# Patient Record
Sex: Female | Born: 1990
Health system: Southern US, Community
[De-identification: ages and names within clinical notes are randomized; demographics above are authoritative.]

## PROBLEM LIST (undated history)

## (undated) ENCOUNTER — Inpatient Hospital Stay (HOSPITAL_COMMUNITY): Payer: Self-pay

## (undated) DIAGNOSIS — F329 Major depressive disorder, single episode, unspecified: Secondary | ICD-10-CM

## (undated) DIAGNOSIS — A749 Chlamydial infection, unspecified: Secondary | ICD-10-CM

## (undated) DIAGNOSIS — F32A Depression, unspecified: Secondary | ICD-10-CM

## (undated) DIAGNOSIS — F319 Bipolar disorder, unspecified: Secondary | ICD-10-CM

## (undated) DIAGNOSIS — J45909 Unspecified asthma, uncomplicated: Secondary | ICD-10-CM

## (undated) DIAGNOSIS — F419 Anxiety disorder, unspecified: Secondary | ICD-10-CM

## (undated) DIAGNOSIS — Q860 Fetal alcohol syndrome (dysmorphic): Secondary | ICD-10-CM

## (undated) HISTORY — DX: Major depressive disorder, single episode, unspecified: F32.9

## (undated) HISTORY — PX: CHOLECYSTECTOMY: SHX55

## (undated) HISTORY — DX: Anxiety disorder, unspecified: F41.9

## (undated) HISTORY — DX: Depression, unspecified: F32.A

## (undated) HISTORY — PX: OTHER SURGICAL HISTORY: SHX169

---

## 2017-03-13 DIAGNOSIS — K219 Gastro-esophageal reflux disease without esophagitis: Secondary | ICD-10-CM | POA: Diagnosis not present

## 2017-03-13 DIAGNOSIS — R079 Chest pain, unspecified: Secondary | ICD-10-CM | POA: Diagnosis not present

## 2017-03-13 DIAGNOSIS — K810 Acute cholecystitis: Secondary | ICD-10-CM | POA: Diagnosis not present

## 2017-03-13 DIAGNOSIS — K8000 Calculus of gallbladder with acute cholecystitis without obstruction: Secondary | ICD-10-CM | POA: Diagnosis not present

## 2017-03-13 DIAGNOSIS — K8010 Calculus of gallbladder with chronic cholecystitis without obstruction: Secondary | ICD-10-CM | POA: Diagnosis not present

## 2017-03-13 DIAGNOSIS — F17210 Nicotine dependence, cigarettes, uncomplicated: Secondary | ICD-10-CM | POA: Diagnosis not present

## 2017-03-13 DIAGNOSIS — R1013 Epigastric pain: Secondary | ICD-10-CM | POA: Diagnosis not present

## 2017-03-14 DIAGNOSIS — K810 Acute cholecystitis: Secondary | ICD-10-CM | POA: Diagnosis not present

## 2017-03-14 DIAGNOSIS — R1013 Epigastric pain: Secondary | ICD-10-CM | POA: Diagnosis not present

## 2017-03-14 DIAGNOSIS — K8019 Calculus of gallbladder with other cholecystitis with obstruction: Secondary | ICD-10-CM | POA: Diagnosis not present

## 2017-03-14 DIAGNOSIS — K8010 Calculus of gallbladder with chronic cholecystitis without obstruction: Secondary | ICD-10-CM | POA: Diagnosis not present

## 2017-05-18 DIAGNOSIS — M792 Neuralgia and neuritis, unspecified: Secondary | ICD-10-CM | POA: Diagnosis not present

## 2017-05-18 DIAGNOSIS — K047 Periapical abscess without sinus: Secondary | ICD-10-CM | POA: Diagnosis not present

## 2017-05-18 DIAGNOSIS — K029 Dental caries, unspecified: Secondary | ICD-10-CM | POA: Diagnosis not present

## 2017-05-18 DIAGNOSIS — R079 Chest pain, unspecified: Secondary | ICD-10-CM | POA: Diagnosis not present

## 2017-05-18 DIAGNOSIS — M79602 Pain in left arm: Secondary | ICD-10-CM | POA: Diagnosis not present

## 2017-12-26 ENCOUNTER — Emergency Department (HOSPITAL_COMMUNITY)
Admission: EM | Admit: 2017-12-26 | Discharge: 2017-12-27 | Disposition: A | Discharge status: Home / Self Care | Payer: Medicare Other | Attending: Emergency Medicine | Admitting: Emergency Medicine

## 2017-12-26 ENCOUNTER — Encounter (HOSPITAL_COMMUNITY): Payer: Self-pay | Admitting: Emergency Medicine

## 2017-12-26 ENCOUNTER — Other Ambulatory Visit: Payer: Self-pay

## 2017-12-26 DIAGNOSIS — O26891 Other specified pregnancy related conditions, first trimester: Secondary | ICD-10-CM | POA: Diagnosis not present

## 2017-12-26 DIAGNOSIS — R1010 Upper abdominal pain, unspecified: Secondary | ICD-10-CM | POA: Diagnosis not present

## 2017-12-26 DIAGNOSIS — Z5321 Procedure and treatment not carried out due to patient leaving prior to being seen by health care provider: Secondary | ICD-10-CM | POA: Insufficient documentation

## 2017-12-26 DIAGNOSIS — R109 Unspecified abdominal pain: Secondary | ICD-10-CM | POA: Diagnosis not present

## 2017-12-26 LAB — COMPREHENSIVE METABOLIC PANEL
ALK PHOS: 63 U/L (ref 38–126)
ALT: 12 U/L (ref 0–44)
AST: 14 U/L — AB (ref 15–41)
Albumin: 3.5 g/dL (ref 3.5–5.0)
Anion gap: 6 (ref 5–15)
BUN: 10 mg/dL (ref 6–20)
CHLORIDE: 107 mmol/L (ref 98–111)
CO2: 22 mmol/L (ref 22–32)
CREATININE: 0.78 mg/dL (ref 0.44–1.00)
Calcium: 9.2 mg/dL (ref 8.9–10.3)
GFR calc non Af Amer: >60 mL/min (ref >60)
Glucose, Bld: 90 mg/dL (ref 70–99)
Potassium: 3.5 mmol/L (ref 3.5–5.1)
SODIUM: 135 mmol/L (ref 135–145)
Total Bilirubin: 0.4 mg/dL (ref 0.3–1.2)
Total Protein: 6.4 g/dL — ABNORMAL LOW (ref 6.5–8.1)

## 2017-12-26 LAB — URINALYSIS, ROUTINE W REFLEX MICROSCOPIC
BACTERIA UA: NONE SEEN
BILIRUBIN URINE: NEGATIVE
Glucose, UA: NEGATIVE mg/dL
Ketones, ur: NEGATIVE mg/dL
Nitrite: NEGATIVE
PH: 6 (ref 5.0–8.0)
Protein, ur: NEGATIVE mg/dL
SPECIFIC GRAVITY, URINE: 1.024 (ref 1.005–1.030)

## 2017-12-26 LAB — CBC
HEMATOCRIT: 37.9 % (ref 36.0–46.0)
Hemoglobin: 12 g/dL (ref 12.0–15.0)
MCH: 28.8 pg (ref 26.0–34.0)
MCHC: 31.7 g/dL (ref 30.0–36.0)
MCV: 90.9 fL (ref 80.0–100.0)
NRBC: 0 % (ref 0.0–0.2)
PLATELETS: 322 10*3/uL (ref 150–400)
RBC: 4.17 MIL/uL (ref 3.87–5.11)
RDW: 13.1 % (ref 11.5–15.5)
WBC: 14 10*3/uL — ABNORMAL HIGH (ref 4.0–10.5)

## 2017-12-26 LAB — I-STAT BETA HCG BLOOD, ED (MC, WL, AP ONLY)

## 2017-12-26 LAB — LIPASE, BLOOD: Lipase: 33 U/L (ref 11–51)

## 2017-12-26 NOTE — ED Triage Notes (Signed)
C/o intermittent mid upper abd pain that radiates to back x 2 days with nausea and diarrhea.  States it feels like when she had gallbladder pain.  Denies urinary complaints.

## 2017-12-26 NOTE — ED Notes (Signed)
Pt up top desk stating that she cannot stay because she has children at home.

## 2017-12-27 ENCOUNTER — Ambulatory Visit (HOSPITAL_COMMUNITY)
Admission: EM | Admit: 2017-12-27 | Discharge: 2017-12-27 | Disposition: A | Discharge status: Home / Self Care | Payer: Medicare Other | Source: Home / Self Care

## 2017-12-27 ENCOUNTER — Encounter (HOSPITAL_COMMUNITY): Payer: Self-pay | Admitting: Student

## 2017-12-27 ENCOUNTER — Inpatient Hospital Stay (EMERGENCY_DEPARTMENT_HOSPITAL)
Admission: AD | Admit: 2017-12-27 | Discharge: 2017-12-27 | Discharge status: Against Medical Advice | Payer: Medicare Other | Source: Ambulatory Visit | Attending: Obstetrics and Gynecology | Admitting: Obstetrics and Gynecology

## 2017-12-27 ENCOUNTER — Other Ambulatory Visit: Payer: Self-pay

## 2017-12-27 ENCOUNTER — Encounter (HOSPITAL_COMMUNITY): Payer: Self-pay | Admitting: Emergency Medicine

## 2017-12-27 DIAGNOSIS — F17210 Nicotine dependence, cigarettes, uncomplicated: Secondary | ICD-10-CM

## 2017-12-27 DIAGNOSIS — Z5329 Procedure and treatment not carried out because of patient's decision for other reasons: Secondary | ICD-10-CM | POA: Insufficient documentation

## 2017-12-27 DIAGNOSIS — O99331 Smoking (tobacco) complicating pregnancy, first trimester: Secondary | ICD-10-CM | POA: Insufficient documentation

## 2017-12-27 DIAGNOSIS — R1010 Upper abdominal pain, unspecified: Secondary | ICD-10-CM

## 2017-12-27 DIAGNOSIS — Z3A01 Less than 8 weeks gestation of pregnancy: Secondary | ICD-10-CM

## 2017-12-27 DIAGNOSIS — O26891 Other specified pregnancy related conditions, first trimester: Secondary | ICD-10-CM | POA: Insufficient documentation

## 2017-12-27 HISTORY — DX: Chlamydial infection, unspecified: A74.9

## 2017-12-27 HISTORY — DX: Unspecified asthma, uncomplicated: J45.909

## 2017-12-27 LAB — URINALYSIS, ROUTINE W REFLEX MICROSCOPIC
Bilirubin Urine: NEGATIVE
GLUCOSE, UA: NEGATIVE mg/dL
Hgb urine dipstick: NEGATIVE
Ketones, ur: NEGATIVE mg/dL
Nitrite: NEGATIVE
PH: 5 (ref 5.0–8.0)
Protein, ur: NEGATIVE mg/dL
SPECIFIC GRAVITY, URINE: 1.024 (ref 1.005–1.030)

## 2017-12-27 NOTE — MAU Note (Signed)
Pt unable to find anyone to stay with her son so she can go to [facility], signed AMA form at registration desk.  Was informed to return if sx's continue.

## 2017-12-27 NOTE — ED Notes (Signed)
Reviewed pt lab work from [employer], pt states shes late on her period and having abdominal pain, with an elevated hcg. Per Linus Mako, pt needs eval and imaging, [facility] Pt given directions and will drive herself there.

## 2017-12-27 NOTE — MAU Note (Signed)
Been having pain, like spasms inside her stomach.  Can't really breath when it happens. Been nauseated. Light headed. Can't sleep through the pain. Feels like her gallbladder ruptured, but she no longer has that. Doesn't know what is going on, went to [facility] last night- couldn't stay because of kids. Went to UC today, they looked at her labs and told her to come here.  Pt IS pregnant, per testing done  Yesterday.

## 2017-12-27 NOTE — MAU Provider Note (Signed)
History     CSN: [id number]  Arrival date and time: 12/27/17 1516   First Provider Initiated Contact with Patient 12/27/17 1723      Chief Complaint  Patient presents with  . Abdominal Pain  . Emesis  . Nausea  . Dizziness   HPI Joanne Hubbard is a 27 y.o. [id number] at [redacted]w[redacted]d by LMP who presents to MAU with chief complaint of upper abdominal pain in the setting of early pregnancy. Abdominal pain is a new problem, onset four days ago. Patient endorses "crampy" pain across the entire top of her abdomen and radiating to her shoulders and right flank. Unable to rate pain but states she cannot sleep through it. Managing with Motrin and Tylenol without success. Aggravated by smoking. Denies alleviating factors. History is significant for gallbladder removal earlier in 2019 in [place]. Patient denies history of ectopic pregnancy. Denies vaginal bleeding, urinary symptoms, flank pain, abnormal vaginal discharge, fever, falls, or recent illness.    Patient smokes approximately 5 cigarettes per day, 7 days per week.  Patient reported to the ED yesterday, had labs drawn, left without being seen by a Provider due to child care issues.  OB History    Gravida  7   Para  4   Term  3   Preterm  1   AB  2   Living  4     SAB  1   TAB  1   Ectopic      Multiple      Live Births              Past Medical History:  Diagnosis Date  . Asthma   . Chlamydia     Past Surgical History:  Procedure Laterality Date  . CESAREAN SECTION    . CHOLECYSTECTOMY    . uterine rupture      Family History  Adopted: Yes    Social History   Tobacco Use  . Smoking status: Current Every Day Smoker    Packs/day: 0.25    Types: Cigarettes  . Smokeless tobacco: Never Used  Substance Use Topics  . Alcohol use: Yes    Comment: drank on 12-25-17  . Drug use: Not Currently    Allergies:  Allergies  Allergen Reactions  . Clonopin [Clonazepam]     "makes me more suicidal"    No  medications prior to admission.    Review of Systems  Constitutional: Positive for fatigue. Negative for chills and fever.  Respiratory: Negative for shortness of breath.   Gastrointestinal: Positive for abdominal pain. Negative for diarrhea, nausea and vomiting.  Genitourinary: Negative for difficulty urinating, dysuria, flank pain, vaginal bleeding, vaginal discharge and vaginal pain.  Musculoskeletal: Negative for back pain.  Neurological: Negative for headaches.  All other systems reviewed and are negative.  Physical Exam   Blood pressure 115/67, pulse 82, temperature 98.5 F (36.9 C), temperature source Oral, resp. rate 18, weight 92.3 kg, last menstrual period 11/21/2017, SpO2 100 %.  Physical Exam  Nursing note and vitals reviewed. Constitutional: She is oriented to person, place, and time. She appears well-developed and well-nourished.  Cardiovascular: Normal rate, normal heart sounds and intact distal pulses.  Respiratory: Effort normal and breath sounds normal. No respiratory distress. She has no wheezes. She has no rales. She exhibits no tenderness.  GI: Soft. Bowel sounds are normal. She exhibits no distension. There is no tenderness. There is no rebound, no guarding and no CVA tenderness.  Musculoskeletal: Normal range of motion.  Neurological: She is alert and oriented to person, place, and time.  Skin: Skin is warm and dry.  Psychiatric: She has a normal mood and affect. Her behavior is normal. Judgment and thought content normal.    MAU Course  Procedures  MDM   Patient Vitals for the past 24 hrs:  BP Temp Temp src Pulse Resp SpO2 Weight  12/27/17 1556 115/67 98.5 F (36.9 C) Oral 82 18 100 % 92.3 kg    Results for orders placed or performed during the hospital encounter of 12/27/17 (from the past 24 hour(s))  Urinalysis, Routine w reflex microscopic     Status: Abnormal   Collection Time: 12/27/17  4:04 PM  Result Value Ref Range   Color, Urine YELLOW  YELLOW   APPearance HAZY (A) CLEAR   Specific Gravity, Urine 1.024 1.005 - 1.030   pH 5.0 5.0 - 8.0   Glucose, UA NEGATIVE NEGATIVE mg/dL   Hgb urine dipstick NEGATIVE NEGATIVE   Bilirubin Urine NEGATIVE NEGATIVE   Ketones, ur NEGATIVE NEGATIVE mg/dL   Protein, ur NEGATIVE NEGATIVE mg/dL   Nitrite NEGATIVE NEGATIVE   Leukocytes, UA TRACE (A) NEGATIVE   RBC / HPF 0-5 0 - 5 RBC/hpf   WBC, UA 0-5 0 - 5 WBC/hpf   Bacteria, UA RARE (A) NONE SEEN   Squamous Epithelial / LPF 11-20 0 - 5   Mucus PRESENT     I advised patient to have imaging tonight to rule out ectopic pregnancy. Patient stated she needed to check with her partner to see if he could watch her 15 year old child while she is in ultrasound.  She left short stay for lobby to discuss with her partner.  Patient signed out AMA without returning from lobby  Calvert Cantor, [place] 12/27/2017, 5:47 PM

## 2017-12-27 NOTE — ED Triage Notes (Signed)
Pt states for the last four days shes had upper abdominal pain, states it feels like her gallbladder but she does not have her gallbladder anymore. Pt was seen in the ER for the same last night but LWBS. Pt states she had blood work done but does not understand it.

## 2018-03-15 ENCOUNTER — Ambulatory Visit (INDEPENDENT_AMBULATORY_CARE_PROVIDER_SITE_OTHER): Payer: Medicare Other | Admitting: *Deleted

## 2018-03-15 ENCOUNTER — Encounter: Payer: Self-pay | Admitting: General Practice

## 2018-03-15 DIAGNOSIS — O0990 Supervision of high risk pregnancy, unspecified, unspecified trimester: Secondary | ICD-10-CM | POA: Insufficient documentation

## 2018-03-15 DIAGNOSIS — Z3480 Encounter for supervision of other normal pregnancy, unspecified trimester: Secondary | ICD-10-CM | POA: Diagnosis not present

## 2018-03-15 LAB — POCT URINALYSIS DIPSTICK OB
Glucose, UA: NEGATIVE
Ketones, UA: NEGATIVE
Leukocytes, UA: NEGATIVE
Nitrite, UA: NEGATIVE
Spec Grav, UA: >=1.03 — AB (ref 1.010–1.025)
Urobilinogen, UA: 2 E.U./dL — AB
pH, UA: 6.5 (ref 5.0–8.0)

## 2018-03-15 MED ORDER — PRENATAL PLUS/IRON 27-1 MG PO TABS: 1.0000 | ORAL_TABLET | Freq: Every day | ORAL | 12 refills | Status: DC

## 2018-03-15 NOTE — Progress Notes (Signed)
   PRENATAL INTAKE SUMMARY  Ms. Braden presents today New OB Nurse Interview.  OB History    Gravida  7   Para  4   Term  3   Preterm  1   AB  2   Living  4     SAB  1   TAB  1   Ectopic      Multiple      Live Births  4          I have reviewed the patient's medical, obstetrical, social, and family histories, medications, and available lab results.  SUBJECTIVE She has complaints of headache, lightheadedness and visual changes. Patient has headache everyday and taking Ibuprofen for relief. Lightheadedness and visual changes does not occur everyday. Patient reported having a preterm delivery. Patient has history of c-section x 1 and VBAC after c-section. Two of her pregnancies decrease fetal heart tones during labor. Pt has history of ruptured uterus with another pregnancy. Pt also has complaints of heart burn.   OBJECTIVE Initial nurse interview for history and lab work (New OB).  EDD: 08/28/2018 by LMP GA:[redacted]w[redacted]d [id number] FHT 156  GENERAL APPEARANCE: alert, well appearing, in no apparent distress, oriented to person, place and time.   ASSESSMENT Normal pregnancy.  PLAN Prenatal care-[facility] Pnl/HIV  OB Urine Culture/Dip GC/CT/PAP at visit with Midwife HgbEval SMA/CF ([facility]) [facility] If [employer] - P/C Ratio and CMP Hgb A1c and Glucose unable to obtained due to insurance coverage PNV sent pharmacy Ultrasound >14 complete ordered Advised patient to discontinue Ibuprofen and take Tylenol. Advised to take Tums for heartburn until visit with Midwife. Referral to [facility] for social issues/concerns Next appt with Eino Farber, CNM  Genetic Screening Results Information: You are having genetic testing called [facility] today.  It will take approximately 2 weeks before the results are available.  To get your results, you need Internet access to a web browser to search [facility]/MyChart (the direct app on your phone will not give you these results).  Then  select Lab Scanned and click on the blue hyper link that says View Image to see your [facility] results.  You can also use the directions on the purple card given to look up your results directly on the [facility] website.  Clovis Pu, RN

## 2018-03-16 LAB — COMPREHENSIVE METABOLIC PANEL
ALBUMIN: 3.8 g/dL — AB (ref 3.9–5.0)
ALT: 13 IU/L (ref 0–32)
AST: 12 IU/L (ref 0–40)
Albumin/Globulin Ratio: 1.8 (ref 1.2–2.2)
Alkaline Phosphatase: 72 IU/L (ref 39–117)
BILIRUBIN TOTAL: 0.3 mg/dL (ref 0.0–1.2)
BUN/Creatinine Ratio: 9 (ref 9–23)
BUN: 5 mg/dL — AB (ref 6–20)
CALCIUM: 9.5 mg/dL (ref 8.7–10.2)
CHLORIDE: 105 mmol/L (ref 96–106)
CO2: 21 mmol/L (ref 20–29)
CREATININE: 0.57 mg/dL (ref 0.57–1.00)
GFR calc non Af Amer: 128 mL/min/{1.73_m2} (ref >59)
GFR, EST AFRICAN AMERICAN: 148 mL/min/{1.73_m2} (ref >59)
GLUCOSE: 79 mg/dL (ref 65–99)
Globulin, Total: 2.1 g/dL (ref 1.5–4.5)
Potassium: 3.8 mmol/L (ref 3.5–5.2)
Sodium: 140 mmol/L (ref 134–144)
TOTAL PROTEIN: 5.9 g/dL — AB (ref 6.0–8.5)

## 2018-03-16 LAB — PROTEIN / CREATININE RATIO, URINE
Creatinine, Urine: 200.6 mg/dL
PROTEIN/CREAT RATIO: 83 mg/g{creat} (ref 0–200)
Protein, Ur: 16.6 mg/dL

## 2018-03-17 LAB — OBSTETRIC PANEL, INCLUDING HIV
Antibody Screen: NEGATIVE
Basophils Absolute: 0 10*3/uL (ref 0.0–0.2)
Basos: 0 %
EOS (ABSOLUTE): 0.2 10*3/uL (ref 0.0–0.4)
EOS: 1 %
HEMOGLOBIN: 11.8 g/dL (ref 11.1–15.9)
HIV Screen 4th Generation wRfx: NONREACTIVE
Hematocrit: 34.7 % (ref 34.0–46.6)
Hepatitis B Surface Ag: NEGATIVE
IMMATURE GRANS (ABS): 0.1 10*3/uL (ref 0.0–0.1)
IMMATURE GRANULOCYTES: 1 %
LYMPHS ABS: 2.4 10*3/uL (ref 0.7–3.1)
Lymphs: 21 %
MCH: 29.1 pg (ref 26.6–33.0)
MCHC: 34 g/dL (ref 31.5–35.7)
MCV: 86 fL (ref 79–97)
MONOS ABS: 0.9 10*3/uL (ref 0.1–0.9)
Monocytes: 8 %
NEUTROS PCT: 69 %
Neutrophils Absolute: 8.1 10*3/uL — ABNORMAL HIGH (ref 1.4–7.0)
Platelets: 280 10*3/uL (ref 150–450)
RBC: 4.06 x10E6/uL (ref 3.77–5.28)
RDW: 13 % (ref 11.7–15.4)
RH TYPE: POSITIVE
RPR Ser Ql: NONREACTIVE
RUBELLA: 2.91 {index} (ref >0.99)
WBC: 11.7 10*3/uL — ABNORMAL HIGH (ref 3.4–10.8)

## 2018-03-17 LAB — HEMOGLOBINOPATHY EVALUATION
FERRITIN: 24 ng/mL (ref 15–150)
HGB A2 QUANT: 2.2 % (ref 1.8–3.2)
HGB A: 97.8 % (ref 96.4–98.8)
HGB F QUANT: 0 % (ref 0.0–2.0)
HGB S: 0 %
Hgb C: 0 %
Hgb Solubility: NEGATIVE
Hgb Variant: 0 %

## 2018-03-18 LAB — URINE CULTURE, OB REFLEX

## 2018-03-18 LAB — CULTURE, OB URINE

## 2018-03-21 ENCOUNTER — Telehealth: Payer: Self-pay | Admitting: *Deleted

## 2018-03-21 MED ORDER — AMOXICILLIN 875 MG PO TABS: 875.0000 mg | ORAL_TABLET | Freq: Two times a day (BID) | ORAL | 0 refills | Status: AC

## 2018-03-21 NOTE — Telephone Encounter (Signed)
-----   Message from [place], [place] sent at 03/18/2018  8:54 PM EST ----- Please prescribe her Amoxicillin 875 mg BID x 5 days. Please advise her to take this medication with food. It will often cause GI issues.  Thanks.

## 2018-03-24 ENCOUNTER — Encounter: Payer: Self-pay | Admitting: General Practice

## 2018-03-28 ENCOUNTER — Encounter (HOSPITAL_COMMUNITY): Payer: Self-pay

## 2018-03-30 ENCOUNTER — Encounter: Payer: Self-pay | Admitting: General Practice

## 2018-04-05 ENCOUNTER — Other Ambulatory Visit (HOSPITAL_COMMUNITY): Payer: Self-pay | Admitting: *Deleted

## 2018-04-05 ENCOUNTER — Other Ambulatory Visit: Payer: Self-pay | Admitting: Obstetrics and Gynecology

## 2018-04-05 ENCOUNTER — Ambulatory Visit: Payer: Medicare Other | Admitting: Clinical

## 2018-04-05 ENCOUNTER — Ambulatory Visit (HOSPITAL_COMMUNITY)
Admission: RE | Admit: 2018-04-05 | Discharge: 2018-04-05 | Disposition: A | Discharge status: Home / Self Care | Payer: Medicare Other | Source: Ambulatory Visit | Attending: Obstetrics and Gynecology | Admitting: Obstetrics and Gynecology

## 2018-04-05 DIAGNOSIS — O34219 Maternal care for unspecified type scar from previous cesarean delivery: Secondary | ICD-10-CM | POA: Diagnosis not present

## 2018-04-05 DIAGNOSIS — Z362 Encounter for other antenatal screening follow-up: Secondary | ICD-10-CM

## 2018-04-05 DIAGNOSIS — O09292 Supervision of pregnancy with other poor reproductive or obstetric history, second trimester: Secondary | ICD-10-CM

## 2018-04-05 DIAGNOSIS — Z363 Encounter for antenatal screening for malformations: Secondary | ICD-10-CM

## 2018-04-05 DIAGNOSIS — Z3A19 19 weeks gestation of pregnancy: Secondary | ICD-10-CM

## 2018-04-05 DIAGNOSIS — Z3480 Encounter for supervision of other normal pregnancy, unspecified trimester: Secondary | ICD-10-CM | POA: Insufficient documentation

## 2018-04-05 DIAGNOSIS — Z658 Other specified problems related to psychosocial circumstances: Secondary | ICD-10-CM

## 2018-04-05 NOTE — BH Specialist Note (Signed)
Integrated Behavioral Health Initial Visit  MRN: [id number] Name: Joanne Hubbard  Number of Integrated Behavioral Health Clinician visits:: 1/6 Session Start time: 10:10  Session End time: 10:25 Total time: 15 minutes  Type of Service: Integrated Behavioral Health- Individual/Family Interpretor:No. Interpretor Name and Language: n/a   Warm Hand Off Completed.       SUBJECTIVE: Joanne Hubbard is a 28 y.o. female accompanied by n/a Patient was referred by Raelyn Mora, CNM for psychosocial Patient reports the following symptoms/concerns: Pt states her primary concern is food insecurity, and being new to [place]; requesting information about various community resources. Pt also requesting options for local ongoing therapy.  Duration of problem: About 3 months (move to [place]); Severity of problem: mild  OBJECTIVE: Mood: Normal and Affect: Appropriate Risk of harm to self or others: No plan to harm self or others  LIFE CONTEXT: Family and Social: Pt lives with FOB and children (7yo,5yo,3yo,2yo); family [place]state School/Work: - Self-Care: - Life Changes: Current pregnancy; recent move to [place]   GOALS ADDRESSED: Patient will: 1. Reduce symptoms of: stress 2. Increase knowledge and/or ability of: stress reduction  3. Demonstrate ability to: Increase healthy adjustment to current life circumstances and Increase adequate support systems for patient/family  INTERVENTIONS: Interventions utilized: Psychoeducation and/or Health Education and Link to [employer]  Standardized Assessments completed: GAD-7 and PHQ 9  ASSESSMENT: Patient currently experiencing Psychosocial stress.   Patient may benefit from psychoeducation and brief therapeutic interventions regarding coping with psychosocial stress.  Marland Kitchen  PLAN: 1. Follow up with behavioral health clinician on : As needed 2. Behavioral recommendations:  -Continue with plan to apply for Pregnancy Medicaid asap -Contact food  pantries on list provided today; attend pantry that does not require referral this week.  -Set up Resource Counseling appointment with [facility] for additional food pantry referrals, as needed -Read educational materials regarding coping with symptoms of anxiety, panic attacks, and depression (prevention) -Consider additional relevant community resources discussed today, as needed (Therapy options, Adoption agencies, Housing, Transportation, [employer])  3. Referral(s): Integrated Art gallery manager (In Clinic), Community Mental Health Services (LME/Outside Clinic) and [employer] Resources:  Arts administrator, Housing and Transportation 4. "From scale of 1-10, how likely are you to follow plan?": 10  Rae Lips, LCSW  Depression screen [facility] 2/9 04/05/2018  Decreased Interest 1  Down, Depressed, Hopeless 1  PHQ - 2 Score 2  Altered sleeping 1  Tired, decreased energy 1  Change in appetite 2  Feeling bad or failure about yourself  0  Trouble concentrating 1  Moving slowly or fidgety/restless 0  Suicidal thoughts 0  PHQ-9 Score 7   GAD 7 : Generalized Anxiety Score 04/05/2018  Nervous, Anxious, on Edge 2  Control/stop worrying 1  Worry too much - different things 1  Trouble relaxing 1  Restless 0  Easily annoyed or irritable 1  Afraid - awful might happen 0  Total GAD 7 Score 6

## 2018-04-14 ENCOUNTER — Encounter: Payer: Medicare Other | Admitting: Family

## 2018-04-15 ENCOUNTER — Encounter: Payer: Self-pay | Admitting: Family

## 2018-04-15 ENCOUNTER — Other Ambulatory Visit (HOSPITAL_COMMUNITY)
Admission: RE | Admit: 2018-04-15 | Discharge: 2018-04-15 | Disposition: A | Discharge status: Home / Self Care | Payer: Medicare Other | Source: Ambulatory Visit | Attending: Obstetrics and Gynecology | Admitting: Obstetrics and Gynecology

## 2018-04-15 ENCOUNTER — Encounter: Payer: Self-pay | Admitting: General Practice

## 2018-04-15 ENCOUNTER — Ambulatory Visit (INDEPENDENT_AMBULATORY_CARE_PROVIDER_SITE_OTHER): Payer: Medicare Other | Admitting: Family

## 2018-04-15 VITALS — BP 114/72 | HR 90 | Wt 232.4 lb

## 2018-04-15 DIAGNOSIS — O09212 Supervision of pregnancy with history of pre-term labor, second trimester: Secondary | ICD-10-CM | POA: Diagnosis not present

## 2018-04-15 DIAGNOSIS — Z3480 Encounter for supervision of other normal pregnancy, unspecified trimester: Secondary | ICD-10-CM

## 2018-04-15 DIAGNOSIS — O99332 Smoking (tobacco) complicating pregnancy, second trimester: Secondary | ICD-10-CM | POA: Diagnosis not present

## 2018-04-15 DIAGNOSIS — F17210 Nicotine dependence, cigarettes, uncomplicated: Secondary | ICD-10-CM | POA: Diagnosis not present

## 2018-04-15 DIAGNOSIS — Z98891 History of uterine scar from previous surgery: Secondary | ICD-10-CM

## 2018-04-15 DIAGNOSIS — Z3402 Encounter for supervision of normal first pregnancy, second trimester: Secondary | ICD-10-CM

## 2018-04-15 DIAGNOSIS — Z3A20 20 weeks gestation of pregnancy: Secondary | ICD-10-CM

## 2018-04-15 DIAGNOSIS — O98519 Other viral diseases complicating pregnancy, unspecified trimester: Secondary | ICD-10-CM | POA: Diagnosis not present

## 2018-04-15 DIAGNOSIS — Z8751 Personal history of pre-term labor: Secondary | ICD-10-CM

## 2018-04-15 DIAGNOSIS — B009 Herpesviral infection, unspecified: Secondary | ICD-10-CM

## 2018-04-15 DIAGNOSIS — O23592 Infection of other part of genital tract in pregnancy, second trimester: Secondary | ICD-10-CM

## 2018-04-15 DIAGNOSIS — N760 Acute vaginitis: Secondary | ICD-10-CM

## 2018-04-15 MED ORDER — TERCONAZOLE 0.4 % VA CREA: 1.0000 | TOPICAL_CREAM | Freq: Every day | VAGINAL | 0 refills | Status: DC

## 2018-04-15 MED ORDER — ONE-A-DAY WOMENS PRENATAL 1 28-0.8-235 MG PO CAPS: 1.0000 | ORAL_CAPSULE | Freq: Every day | ORAL | 0 refills | Status: DC

## 2018-04-15 MED ORDER — VALACYCLOVIR HCL 500 MG PO TABS: 500.0000 mg | ORAL_TABLET | Freq: Two times a day (BID) | ORAL | 3 refills | Status: DC

## 2018-04-15 NOTE — Progress Notes (Signed)
Subjective:    Joanne Hubbard is a [id number] [redacted]w[redacted]d being seen today for her first obstetrical visit.  Her obstetrical history is significant for smoker (1/4 pk/day, history of preterm delivery at 36 wks, csection for fetal distress, HSV with last outbreak 3 months ago, prior csection, and VBAC with suspected uterine rupture at last birth.   Pt at visit with social worker with Choice program, considering placing baby for adoption.  Pt has a complicated OB history.  First baby delivered at 36 wks, utilized 17-p with subsequent pregnancies.  Csection with 3rd pregnancy and had a VBAC with 4th.  Reports increased bleeding discovered after last pregnancy and was taken back to OR.  Reports a Bakri balloon being placed and was told she had a uterine rupture.  All events occurred at a hospital in [place].  History of both anxiety and depression.  Stopped taking meds with first pregnancy.  Receiving disability due to learning problems identified as child.  Prior visit with [facility], referral to counseling services were provided.    Patient does intend to breast feed if infant not placed for adoption. Pregnancy history fully reviewed.  Patient reports daily headaches during pregnancy preceeded by aura.  Also reports vaginal itching that started after taking antibiotics for UTI.  .  Vitals:   04/15/18 0938  BP: 114/72  Pulse: 90  Weight: 232 lb 6.4 oz (105.4 kg)    HISTORY: OB History  Gravida Para Term Preterm AB Living  7 4 3 1 2 4   SAB TAB Ectopic Multiple Live Births  1 1     4     # Outcome Date GA Lbr Len/2nd Weight Sex Delivery Anes PTL Lv  7 Current           6 Term 02/12/16 [redacted]w[redacted]d  9 lb (4.082 kg) M Vag-Spont   LIV     Complications: Other Excessive Bleeding, Ruptured uterus during labor, delivered  5 Term 04/12/14 [redacted]w[redacted]d  8 lb 1 oz (3.657 kg) F CS-LTranv  N LIV     Complications: Fetal intrauterine distress noted during labor  4 Term 04/25/12 [redacted]w[redacted]d  8 lb 7 oz (3.827 kg) F  Vag-Vacuum   LIV     Complications: Fetal intrauterine distress noted during labor  3 Preterm 11/19/10 [redacted]w[redacted]d  4 lb (1.814 kg) M Vag-Spont  Y LIV  2 TAB           1 SAB            Past Medical History:  Diagnosis Date  . Anxiety   . Asthma    Resoved as child  . Chlamydia   . Depression    Past Surgical History:  Procedure Laterality Date  . CESAREAN SECTION    . CHOLECYSTECTOMY    . uterine rupture     Family History  Adopted: Yes  Family history unknown: Yes     Exam   BP 114/72   Pulse 90   Wt 232 lb 6.4 oz (105.4 kg)   LMP 11/21/2017 (Exact Date)   BMI 40.84 kg/m  Uterine Size: size equals dates  Pelvic Exam:    Perineum: No Hemorrhoids, Normal Perineum   Vulva: normal   Vagina:  normal mucosa, normal discharge, no palpable nodules   pH: Not done   Cervix: No cervical motion tenderness; cervical bleeding noted at 11 oclock.     Adnexa: normal adnexa and no mass, fullness, tenderness   Bony Pelvis: Adequate  System: Breast:  No  nipple retraction or dimpling, No nipple discharge or bleeding, No axillary or supraclavicular adenopathy, Normal to palpation without dominant masses   Skin: normal coloration and turgor, no rashes; prior surgery skin scar on abdomen    Neurologic: negative   Extremities: normal strength, tone, and muscle mass   HEENT neck supple with midline trachea and thyroid without masses   Mouth/Teeth mucous membranes moist, pharynx normal without lesions   Neck supple and no masses   Cardiovascular: regular rate and rhythm, no murmurs or gallops   Respira, counseling,tory:  appears well, vitals normal, no respiratory distress, acyanotic, normal RR, neck free of mass or lymphadenopathy, chest clear, no wheezing, crepitations, rhonchi, normal symmetric air entry   Abdomen: soft, non-tender; bowel sounds normal; no masses,  no organomegaly   Urinary: urethral meatus normal     Assessment:    Pregnancy: [id number] Patient Active Problem List    Diagnosis Date Noted  . History of preterm delivery 04/15/2018  . History of C-section 04/15/2018  . Herpes simplex type 2 (HSV-2) infection affecting pregnancy, antepartum 04/15/2018  . Uterine rupture during labor 04/15/2018  . Supervision of high risk pregnancy, antepartum 03/15/2018      Plan:   Initial labs and ultrasound reviewed with patient - all wnl. Referred to [facility] for migraine visit Recommended she reach out to counseling services for long-term management Begin prenatal vitamins. Begin 17-p at next visit Record release form signed for hospital records in [facility] for vaginitis s/p antibiotics (likely yeast infection)  RX Valtrex due to frequent infections Problem list reviewed and updated. Genetic Screening results reviewed: NIPS low risk, female Smoking and tobacco cessation was discussed at today's visit for 6 minutes.    Follow up in 1 week for 17-p.     Eino Farber N Kar[place]Rhoades 04/15/2018

## 2018-04-19 LAB — CYTOLOGY - PAP: DIAGNOSIS: NEGATIVE

## 2018-04-20 LAB — CERVICOVAGINAL ANCILLARY ONLY
BACTERIAL VAGINITIS: NEGATIVE
Candida vaginitis: POSITIVE — AB
Chlamydia: NEGATIVE
NEISSERIA GONORRHEA: NEGATIVE
TRICH (WINDOWPATH): NEGATIVE

## 2018-04-25 ENCOUNTER — Encounter: Payer: Self-pay | Admitting: General Practice

## 2018-05-03 ENCOUNTER — Ambulatory Visit (HOSPITAL_COMMUNITY)
Admission: RE | Admit: 2018-05-03 | Discharge: 2018-05-03 | Disposition: A | Discharge status: Home / Self Care | Payer: Medicare Other | Source: Ambulatory Visit | Attending: Obstetrics and Gynecology | Admitting: Obstetrics and Gynecology

## 2018-05-03 DIAGNOSIS — Z362 Encounter for other antenatal screening follow-up: Secondary | ICD-10-CM | POA: Diagnosis not present

## 2018-05-03 DIAGNOSIS — Z3A23 23 weeks gestation of pregnancy: Secondary | ICD-10-CM | POA: Diagnosis not present

## 2018-05-03 DIAGNOSIS — O34219 Maternal care for unspecified type scar from previous cesarean delivery: Secondary | ICD-10-CM

## 2018-05-03 DIAGNOSIS — O09292 Supervision of pregnancy with other poor reproductive or obstetric history, second trimester: Secondary | ICD-10-CM | POA: Diagnosis not present

## 2018-05-05 ENCOUNTER — Telehealth: Payer: Self-pay | Admitting: *Deleted

## 2018-05-05 NOTE — Telephone Encounter (Signed)
Spoke Derdlim, Makena regarding patient's co-pay. Per patient's pharmacy she will need to pay $1,197.99 before medication can be sent out. Derdlim was going to check on the process. Patient's co-pay should not be that much.  Clovis Pu, RN

## 2018-05-06 ENCOUNTER — Ambulatory Visit (INDEPENDENT_AMBULATORY_CARE_PROVIDER_SITE_OTHER): Payer: Medicare Other | Admitting: Physician Assistant

## 2018-05-06 ENCOUNTER — Other Ambulatory Visit: Payer: Self-pay

## 2018-05-06 ENCOUNTER — Encounter: Payer: Self-pay | Admitting: Physician Assistant

## 2018-05-06 VITALS — BP 114/75 | HR 75 | Wt 240.2 lb

## 2018-05-06 DIAGNOSIS — R51 Headache: Secondary | ICD-10-CM | POA: Diagnosis not present

## 2018-05-06 DIAGNOSIS — M62838 Other muscle spasm: Secondary | ICD-10-CM

## 2018-05-06 DIAGNOSIS — O26899 Other specified pregnancy related conditions, unspecified trimester: Secondary | ICD-10-CM

## 2018-05-06 DIAGNOSIS — R519 Headache, unspecified: Secondary | ICD-10-CM

## 2018-05-06 DIAGNOSIS — G43009 Migraine without aura, not intractable, without status migrainosus: Secondary | ICD-10-CM

## 2018-05-06 MED ORDER — CYCLOBENZAPRINE HCL 10 MG PO TABS: 10.0000 mg | ORAL_TABLET | Freq: Three times a day (TID) | ORAL | 1 refills | Status: DC | PRN

## 2018-05-06 MED ORDER — METOCLOPRAMIDE HCL 10 MG PO TABS: 10.0000 mg | ORAL_TABLET | Freq: Three times a day (TID) | ORAL | 1 refills | Status: DC | PRN

## 2018-05-06 NOTE — Progress Notes (Signed)
History:  Joanne Hubbard is a 28 y.o. [id number] who presents to clinic today for evaluation of headache.  She notes that she got headaches as a teenager but they have worsened over the last year.  She has not noticed any improvement or worsening since the pregnancy.  The pain begins on the right and is most noticed by the right eye but can spread to the entire head.  It gets to be severe and there is throbbing.  Movement makes it worse.  There is associated neck pain, photophobia, phonophobia.  No issue with smells.  There is nausea and vomiting.  Her vision goes black and there are black dots.  Her eyes water. She has 4 kids: 7, 5, 4, 2 and one on the way (23 weeks currently).  She is using Tylenol PM to help.    HIT6:72 Number of days in the last 4 weeks with:  Severe headache: 7 Moderate headache: 5 Mild headache: 7  No headache: 9   Past Medical History:  Diagnosis Date  . Anxiety   . Asthma    Resoved as child  . Chlamydia   . Depression     Social History   Socioeconomic History  . Marital status: Single    Spouse name: Not on file  . Number of children: 4  . Years of education: Not on file  . Highest education level: 11th grade  Occupational History  . Occupation: Unemployed  Social Needs  . Financial resource strain: Hard  . Food insecurity:    Worry: Sometimes true    Inability: Sometimes true  . Transportation needs:    Medical: Yes    Non-medical: Yes  Tobacco Use  . Smoking status: Current Every Day Smoker    Packs/day: 0.25    Years: 7.00    Pack years: 1.75    Types: Cigarettes  . Smokeless tobacco: Never Used  Substance and Sexual Activity  . Alcohol use: Not Currently    Comment: drank on 12-25-17  . Drug use: Not Currently  . Sexual activity: Yes    Birth control/protection: None  Lifestyle  . Physical activity:    Days per week: 0 days    Minutes per session: 0 min  . Stress: To some extent  Relationships  . Social connections:    Talks on  phone: Twice a week    Gets together: Never    Attends religious service: Never    Active member of club or organization: No    Attends meetings of clubs or organizations: Never    Relationship status: Living with partner  . Intimate partner violence:    Fear of current or ex partner: No    Emotionally abused: No    Physically abused: No    Forced sexual activity: No  Other Topics Concern  . Not on file  Social History Narrative   Behavior referral placed to [facility] for social issues. Patient also need a referral for social worker.    Family History  Adopted: Yes  Family history unknown: Yes    Allergies  Allergen Reactions  . Clonopin [Clonazepam]     "makes me more suicidal"    Current Outpatient Medications on File Prior to Visit  Medication Sig Dispense Refill  . Prenat-Fe Carbonyl-FA-Omega 3 (ONE-A-DAY WOMENS PRENATAL 1) 28-0.8-235 MG CAPS Take 1 capsule by mouth daily. 25 capsule 0  . Prenatal Vit-Fe Fumarate-FA (PRENATAL PLUS/IRON) 27-1 MG TABS Take 1 tablet by mouth daily. (Patient not taking: Reported on  04/15/2018) 30 each 12  . terconazole (TERAZOL 7) 0.4 % vaginal cream Place 1 applicator vaginally at bedtime. (Patient not taking: Reported on 05/06/2018) 45 g 0  . valACYclovir (VALTREX) 500 MG tablet Take 1 tablet (500 mg total) by mouth 2 (two) times daily. (Patient not taking: Reported on 05/06/2018) 60 tablet 3   No current facility-administered medications on file prior to visit.      Review of Systems:  All pertinent positive/negative included in HPI, all other review of systems are negative   Objective:  Physical Exam BP 114/75   Pulse 75   Wt 240 lb 3.2 oz (109 kg)   LMP 11/21/2017 (Exact Date)   BMI 42.21 kg/m  CONSTITUTIONAL: Well-developed, well-nourished female in no acute distress.  EYES: EOM intact ENT: Normocephalic CARDIOVASCULAR: Regular rate RESPIRATORY: Normal rate.  MUSCULOSKELETAL: Normal ROM SKIN: Warm, dry without erythema   NEUROLOGICAL: Alert, oriented, CN II-XII grossly intact, Appropriate balance PSYCH: Normal behavior, mood   Assessment & Plan:  Assessment: 1. Migraine without aura and without status migrainosus, not intractable   2. Pregnancy headache, antepartum   3. Muscle spasm    New Diagnoses  Plan: May use Flexeril for neck pain/headache.  Use 1/2 -1 tab as tolerated.  Sedation precautions given Reglan may be used during the day for headache/nausea Biofreeze may be helpful Hydrate well Keep regular schedule Early morning/outdoor exercise recommended Follow-up PRN  Charlyne Petrin 05/06/2018 9:31 AM

## 2018-05-06 NOTE — Patient Instructions (Signed)

## 2018-05-13 ENCOUNTER — Other Ambulatory Visit: Payer: Self-pay

## 2018-05-13 ENCOUNTER — Encounter: Payer: Self-pay | Admitting: General Practice

## 2018-05-13 ENCOUNTER — Ambulatory Visit (INDEPENDENT_AMBULATORY_CARE_PROVIDER_SITE_OTHER): Payer: Medicare Other | Admitting: Family

## 2018-05-13 VITALS — BP 124/77 | HR 93 | Temp 98.4°F | Wt 243.2 lb

## 2018-05-13 DIAGNOSIS — O98512 Other viral diseases complicating pregnancy, second trimester: Secondary | ICD-10-CM | POA: Diagnosis not present

## 2018-05-13 DIAGNOSIS — O26892 Other specified pregnancy related conditions, second trimester: Secondary | ICD-10-CM

## 2018-05-13 DIAGNOSIS — B009 Herpesviral infection, unspecified: Secondary | ICD-10-CM

## 2018-05-13 DIAGNOSIS — G43009 Migraine without aura, not intractable, without status migrainosus: Secondary | ICD-10-CM

## 2018-05-13 DIAGNOSIS — O09212 Supervision of pregnancy with history of pre-term labor, second trimester: Secondary | ICD-10-CM

## 2018-05-13 DIAGNOSIS — O98519 Other viral diseases complicating pregnancy, unspecified trimester: Secondary | ICD-10-CM

## 2018-05-13 DIAGNOSIS — Z98891 History of uterine scar from previous surgery: Secondary | ICD-10-CM

## 2018-05-13 DIAGNOSIS — Z8751 Personal history of pre-term labor: Secondary | ICD-10-CM

## 2018-05-13 DIAGNOSIS — Z3A24 24 weeks gestation of pregnancy: Secondary | ICD-10-CM | POA: Diagnosis not present

## 2018-05-13 DIAGNOSIS — O0990 Supervision of high risk pregnancy, unspecified, unspecified trimester: Secondary | ICD-10-CM

## 2018-05-13 NOTE — Patient Instructions (Addendum)
   Coronavirus (COVID-19) and Pregnancy:  Frequently Asked Questions   How might coronavirus affect my pregnancy? The data for COVID-19 is limited, but we know that women with other coronavirus infections (such as SARS-CoV) did NOT have miscarriage or stillbirth at higher rates than the general population.  On the other hand, we know that having other respiratory viral infections during pregnancy, such as flu, has been associated with problems like low birth weight and preterm birth. Also, having a high fever early in pregnancy may increase the risk of certain birth defects.  Could I transmit coronavirus to my baby during pregnancy or delivery? Among the few case studies of infants born to mothers with COVID-19 published in peer-reviewed literature, none of the infants tested positive for the virus. And there have been no reports of mother-to-baby transmission for other coronaviruses (MERS-CoV and SARS-CoV). Also, there was no virus detected in samples of amniotic fluid or breast milk. But there have been a few reports of newborns as young as a few days old with infection, suggesting that a mother can transmit the infection to her infant through close contact after delivery.  Is it safe for me to deliver at a hospital where there have been COVID-19 cases? It should be. We know that COVID-19 is a very scary virus. The good news is that hospitals are taking great precautions to keep patients and healthcare providers safe.  According to the CDC guidelines, when a patient is even suspected to have COVID-19, they should be placed in a negative pressure room. (Think of these rooms as vacuums that suck and filter the air so it's safe for the other people in the hospital.) If there are no rooms available, these patients should be asked to wait at home until they can be accomodated safely. This should make it possible for you to deliver at the hospital without putting you or your baby at risk.  Hospitals are  also implementing stricter visiting policies to keep patients safe. It's worth calling your hospital to check if there are any new regulations to be aware of.  What plans should I make now in case the hospital system is overwhelmed when it's time for me to deliver? Every hospital is making different plans for dealing with this scenario. Talk with your doctor or midwife once you're at least [redacted] weeks pregnant. I work in healthcare.   Should I ask my doctor to excuse me from work until the baby is born? What if I work in a school, the travel industry, or some other high-risk setting? Healthcare facilities should take care to limit the exposure of pregnant employees to patients with confirmed or suspected COVID-19, just as they would with other infectious cases. If you continue working, be sure to follow the CDC's risk assessment and infection control guidelines. If you work in a school, travel industry, or other high-risk setting, talk with your employer about what it's doing to protect employees and minimize infection risks. Wash your hands often. advertisement  page continues below  What if my OB gets COVID-19? If your doctor or midwife tests positive for COVID-19, they will need to be quarantined until they recover and are no longer at risk of transmitting the virus. In this case, you'll be assigned to another OB in your doctor's practice (or you may choose another practitioner yourself). Ask your new OB or your doctor's office if you should self-quarantine or be tested for the virus. (It will depend on when you last saw   your provider and when that person tested positive.)  Should we hold off on trying to conceive because of COVID-19? At this time, there's no reason to hold off on trying to get pregnant, but the data we have is really limited. For example, we don't think the virus causes birth defects or increases your risk of miscarriage. But we don't know for sure whether you could transmit  COVID-19 to your baby before or during delivery. We also don't know if the virus lives in semen or can be sexually transmitted.  We have a babymoon scheduled in the next few months - should we cancel? Yes. At this time, the virus has reached more than 140 countries, and there are travel bans to [place], most of [place], and [place]. Places where large numbers of people gather are at highest risk, especially airports and cruise ships.  If you were planning travel in the [place] note that any travel setting increases your risk of exposure, and there are already many places where everyone is being asked to stay home. To see how the virus is spreading, check The [place] Times map based on CDC data.  For the most current advice to help you avoid exposure, check the CDC's COVID-19 travel page.  Will the hospital separate me from my newborn and keep the baby in quarantine? If you don't have COVID-19 and have not been exposed to the virus, the hospital will not separate you from your newborn. If you do test positive for COVID-19 or have been exposed but have no symptoms, the CDC, American College of Obstetricians and Gynecologists, and the Society for Maternal-Fetal Medicine all recommend that you be separated from your baby to decrease the risk of transmission to the baby. This would last until you are no longer at risk of transmitting the virus.  This scenario would, of course, be beyond heartbreaking. Talk to the hospital, your baby's pediatrician, and your family about how to plan for care of your baby in the event that you have to be separated after delivery. And try to make sure you have the emotional support you would need to endure the sadness and stress of having to potentially wait weeks to meet your newborn.   My hospital is restricting visitors and only allowing one support person. If my support person leaves after the delivery, will they be allowed to come back? Every hospital has different policies.  Contact your hospital or labor and delivery unit a week or so before delivery to get the most up-to-date restrictions. In general, if your support person needs to leave, they would be allowed back unless they knew they were exposed to COVID-19 after leaving your company.  My mom was planning to fly here to help me care for my new baby after delivery. Should I tell her not to come? Yes. If your mom is over 60 or has any serious chronic medical conditions (such as heart disease, lung disease, or diabetes), she is at higher risk of serious illness from COVID-19 and should avoid air travel. And remember that any travel setting increases a person's risk of exposure. So, it may be risky to have her around the baby after she has been traveling. For the most current advice on traveling, check the CDC's COVID-19 travel page. 

## 2018-05-13 NOTE — Progress Notes (Signed)
   PRENATAL VISIT NOTE  Subjective:  Joanne Hubbard is a 28 y.o. [id number] at [redacted]w[redacted]d being seen today for ongoing prenatal care.  She is currently monitored for the following issues for this high-risk pregnancy and has Supervision of high risk pregnancy, antepartum; History of preterm delivery; History of C-section; Herpes simplex type 2 (HSV-2) infection affecting pregnancy, antepartum; Uterine rupture during labor; Migraine without aura and without status migrainosus, not intractable; Pregnancy headache, antepartum; and Muscle spasm on their problem list.  Patient reports headache and decreased sleep.  Past appt with Clydie Braun for headache management.  +Flexeril with minimal relief.   Contractions: Irritability. Vag. Bleeding: None.  Movement: Present. Denies leaking of fluid.   The following portions of the patient's history were reviewed and updated as appropriate: allergies, current medications, past family history, past medical history, past social history, past surgical history and problem list.   Objective:   Vitals:   05/13/18 0923  BP: 124/77  Pulse: 93  Temp: 98.4 F (36.9 C)  Weight: 243 lb 3.2 oz (110.3 kg)    Fetal Status: Fetal Heart Rate (bpm): 140 Fundal Height: 27 cm Movement: Present     General:  Alert, oriented and cooperative. Patient is in no acute distress.  Skin: Skin is warm and dry. No rash noted.   Cardiovascular: Normal heart rate noted  Respiratory: Normal respiratory effort, no problems with respiration noted  Abdomen: Soft, gravid, appropriate for gestational age.  Pain/Pressure: Present     Pelvic: Cervical exam deferred        Extremities: Normal range of motion.  Edema: None  Mental Status: Normal mood and affect. Normal behavior. Normal judgment and thought content.   Assessment and Plan:  Pregnancy: [id number] at [redacted]w[redacted]d 1. Migraine without aura and without status migrainosus, not intractable - Schedule follow-up appt with Migraine specialists  2.  Supervision of high risk pregnancy, antepartum - Reviewed safety precautions as related to Coronavirus - Given patient education in instructions - Obtain tubal consent at next visit  3. History of preterm delivery - Pending receipt of 17-p; Tamika RN to call for status - Begin weekly injections  4. History of C-section - Reviewed plan to schedule repeat csection at 37 wks per Dr. Judeth Cornfield recommendation  5. Herpes simplex type 2 (HSV-2) infection affecting pregnancy, antepartum - Continue medications  Preterm labor symptoms and general obstetric precautions including but not limited to vaginal bleeding, contractions, leaking of fluid and fetal movement were reviewed in detail with the patient. Please refer to After Visit Summary for other counseling recommendations.   Return in about 2 weeks (around 05/27/2018).  No future appointments.  Eino Farber Eloisa Northern, CNM

## 2018-05-27 ENCOUNTER — Telehealth: Payer: Self-pay | Admitting: General Practice

## 2018-05-27 ENCOUNTER — Encounter: Payer: Medicare Other | Admitting: Family

## 2018-05-27 NOTE — Telephone Encounter (Signed)
Pt missed appt today.  Unable to reach pt via phone to reschedule.  Mychart message sent to pt for pt to give [place] a call to reschedule.

## 2018-06-22 ENCOUNTER — Encounter: Payer: Medicare Other | Admitting: Obstetrics and Gynecology

## 2018-06-23 ENCOUNTER — Telehealth: Payer: Self-pay | Admitting: *Deleted

## 2018-06-23 NOTE — Telephone Encounter (Signed)
Patient's Social Worker Maralyn Sago called regarding medication prevention for preterm delivery for patient. Gaylord Shih that patient has Medicare and primary insurance and Medicaid as second. Pt will need to patient over $1000 co-pay with pharmacy with [facility] before medication can be sent. Called another Set designer, they don't file with Medicare and Medicaid will not cover unless Medicare is filed first.   Also advised her that patient missed appointment from yesterday for 28 week lab work and BTL consent needed to be signed.   Maralyn Sago will follow up with patient regarding information.  Clovis Pu, RN

## 2018-07-01 ENCOUNTER — Encounter: Payer: Medicare Other | Admitting: Physician Assistant

## 2018-07-01 ENCOUNTER — Other Ambulatory Visit: Payer: Self-pay

## 2018-07-01 DIAGNOSIS — R51 Headache: Secondary | ICD-10-CM

## 2018-07-18 ENCOUNTER — Inpatient Hospital Stay (HOSPITAL_COMMUNITY)
Admission: AD | Admit: 2018-07-18 | Discharge: 2018-07-21 | DRG: 783 | Disposition: A | Discharge status: Home / Self Care | Payer: Medicare Other | Attending: Obstetrics & Gynecology | Admitting: Obstetrics & Gynecology

## 2018-07-18 ENCOUNTER — Encounter (HOSPITAL_COMMUNITY): Payer: Self-pay

## 2018-07-18 ENCOUNTER — Inpatient Hospital Stay (HOSPITAL_COMMUNITY): Payer: Medicare Other | Admitting: Anesthesiology

## 2018-07-18 ENCOUNTER — Other Ambulatory Visit: Payer: Self-pay

## 2018-07-18 ENCOUNTER — Encounter (HOSPITAL_COMMUNITY)
Admission: AD | Disposition: A | Discharge status: Home / Self Care | Payer: Self-pay | Source: Home / Self Care | Attending: Obstetrics & Gynecology

## 2018-07-18 DIAGNOSIS — O99334 Smoking (tobacco) complicating childbirth: Secondary | ICD-10-CM | POA: Diagnosis not present

## 2018-07-18 DIAGNOSIS — F17210 Nicotine dependence, cigarettes, uncomplicated: Secondary | ICD-10-CM | POA: Diagnosis not present

## 2018-07-18 DIAGNOSIS — F329 Major depressive disorder, single episode, unspecified: Secondary | ICD-10-CM | POA: Diagnosis present

## 2018-07-18 DIAGNOSIS — Z1159 Encounter for screening for other viral diseases: Secondary | ICD-10-CM

## 2018-07-18 DIAGNOSIS — O99344 Other mental disorders complicating childbirth: Secondary | ICD-10-CM | POA: Diagnosis present

## 2018-07-18 DIAGNOSIS — O403XX0 Polyhydramnios, third trimester, not applicable or unspecified: Secondary | ICD-10-CM | POA: Diagnosis present

## 2018-07-18 DIAGNOSIS — O3663X0 Maternal care for excessive fetal growth, third trimester, not applicable or unspecified: Secondary | ICD-10-CM | POA: Diagnosis present

## 2018-07-18 DIAGNOSIS — O34219 Maternal care for unspecified type scar from previous cesarean delivery: Principal | ICD-10-CM | POA: Diagnosis present

## 2018-07-18 DIAGNOSIS — F419 Anxiety disorder, unspecified: Secondary | ICD-10-CM | POA: Diagnosis not present

## 2018-07-18 DIAGNOSIS — O98519 Other viral diseases complicating pregnancy, unspecified trimester: Secondary | ICD-10-CM

## 2018-07-18 DIAGNOSIS — O0990 Supervision of high risk pregnancy, unspecified, unspecified trimester: Secondary | ICD-10-CM

## 2018-07-18 DIAGNOSIS — Z98891 History of uterine scar from previous surgery: Secondary | ICD-10-CM

## 2018-07-18 DIAGNOSIS — Z8751 Personal history of pre-term labor: Secondary | ICD-10-CM

## 2018-07-18 DIAGNOSIS — O99214 Obesity complicating childbirth: Secondary | ICD-10-CM | POA: Diagnosis not present

## 2018-07-18 DIAGNOSIS — Z302 Encounter for sterilization: Secondary | ICD-10-CM

## 2018-07-18 DIAGNOSIS — Z3A34 34 weeks gestation of pregnancy: Secondary | ICD-10-CM

## 2018-07-18 DIAGNOSIS — B009 Herpesviral infection, unspecified: Secondary | ICD-10-CM

## 2018-07-18 LAB — URINALYSIS, COMPLETE (UACMP) WITH MICROSCOPIC
Bilirubin Urine: NEGATIVE
Glucose, UA: NEGATIVE mg/dL
Hgb urine dipstick: NEGATIVE
Ketones, ur: NEGATIVE mg/dL
Leukocytes,Ua: NEGATIVE
Nitrite: NEGATIVE
Protein, ur: NEGATIVE mg/dL
Specific Gravity, Urine: 1.021 (ref 1.005–1.030)
pH: 6 (ref 5.0–8.0)

## 2018-07-18 LAB — CBC
HCT: 36.3 % (ref 36.0–46.0)
Hemoglobin: 11.5 g/dL — ABNORMAL LOW (ref 12.0–15.0)
MCH: 28.1 pg (ref 26.0–34.0)
MCHC: 31.7 g/dL (ref 30.0–36.0)
MCV: 88.8 fL (ref 80.0–100.0)
Platelets: 275 10*3/uL (ref 150–400)
RBC: 4.09 MIL/uL (ref 3.87–5.11)
RDW: 14.5 % (ref 11.5–15.5)
WBC: 14.3 10*3/uL — ABNORMAL HIGH (ref 4.0–10.5)
nRBC: 0 % (ref 0.0–0.2)

## 2018-07-18 LAB — WET PREP, GENITAL
Sperm: NONE SEEN
Trich, Wet Prep: NONE SEEN
Yeast Wet Prep HPF POC: NONE SEEN

## 2018-07-18 LAB — PREPARE RBC (CROSSMATCH)

## 2018-07-18 LAB — SARS CORONAVIRUS 2 BY RT PCR (HOSPITAL ORDER, PERFORMED IN ~~LOC~~ HOSPITAL LAB): SARS Coronavirus 2: NEGATIVE

## 2018-07-18 LAB — GLUCOSE, CAPILLARY: Glucose-Capillary: 120 mg/dL — ABNORMAL HIGH (ref 70–99)

## 2018-07-18 LAB — ABO/RH: ABO/RH(D): A POS

## 2018-07-18 SURGERY — Surgical Case
Anesthesia: Epidural

## 2018-07-18 MED ORDER — KETOROLAC TROMETHAMINE 30 MG/ML IJ SOLN: 30.0000 mg | Freq: Four times a day (QID) | INTRAMUSCULAR | Status: AC | PRN

## 2018-07-18 MED ORDER — TERBUTALINE SULFATE 1 MG/ML IJ SOLN
INTRAMUSCULAR | Status: AC
  Administered 2018-07-18: 10:00:00 1 mg
  Filled 2018-07-18: qty 1

## 2018-07-18 MED ORDER — NALBUPHINE HCL 10 MG/ML IJ SOLN
5.0000 mg | Freq: Once | INTRAMUSCULAR | Status: DC | PRN
  Filled 2018-07-18: qty 1

## 2018-07-18 MED ORDER — TRANEXAMIC ACID-NACL 1000-0.7 MG/100ML-% IV SOLN
INTRAVENOUS | Status: AC
  Filled 2018-07-18: qty 100

## 2018-07-18 MED ORDER — PHENYLEPHRINE 40 MCG/ML (10ML) SYRINGE FOR IV PUSH (FOR BLOOD PRESSURE SUPPORT): 80.0000 ug | PREFILLED_SYRINGE | INTRAVENOUS | Status: DC | PRN

## 2018-07-18 MED ORDER — LACTATED RINGERS IV SOLN
INTRAVENOUS | Status: DC
  Administered 2018-07-18 (×2): via INTRAVENOUS

## 2018-07-18 MED ORDER — SODIUM CHLORIDE 0.9% FLUSH: 3.0000 mL | INTRAVENOUS | Status: DC | PRN

## 2018-07-18 MED ORDER — SCOPOLAMINE 1 MG/3DAYS TD PT72
1.0000 | MEDICATED_PATCH | Freq: Once | TRANSDERMAL | Status: DC
  Administered 2018-07-18: 1.5 mg via TRANSDERMAL

## 2018-07-18 MED ORDER — WITCH HAZEL-GLYCERIN EX PADS: 1.0000 "application " | MEDICATED_PAD | CUTANEOUS | Status: DC | PRN

## 2018-07-18 MED ORDER — LACTATED RINGERS IV SOLN
500.0000 mL | Freq: Once | INTRAVENOUS | Status: AC
  Administered 2018-07-18: 10:00:00 500 mL via INTRAVENOUS

## 2018-07-18 MED ORDER — ACETAMINOPHEN 325 MG PO TABS: 325.0000 mg | ORAL_TABLET | ORAL | Status: DC | PRN

## 2018-07-18 MED ORDER — NALOXONE HCL 4 MG/10ML IJ SOLN
1.0000 ug/kg/h | INTRAVENOUS | Status: DC | PRN
  Filled 2018-07-18: qty 5

## 2018-07-18 MED ORDER — NIFEDIPINE 10 MG PO CAPS
10.0000 mg | ORAL_CAPSULE | ORAL | Status: DC | PRN
  Administered 2018-07-18 (×2): 10 mg via ORAL
  Filled 2018-07-18 (×4): qty 1

## 2018-07-18 MED ORDER — CEFAZOLIN (ANCEF) 1 G IV SOLR: 3.0000 g | INTRAVENOUS | Status: DC

## 2018-07-18 MED ORDER — BETAMETHASONE SOD PHOS & ACET 6 (3-3) MG/ML IJ SUSP
12.0000 mg | INTRAMUSCULAR | Status: DC
  Administered 2018-07-18: 07:00:00 12 mg via INTRAMUSCULAR
  Filled 2018-07-18: qty 2

## 2018-07-18 MED ORDER — DIPHENHYDRAMINE HCL 50 MG/ML IJ SOLN
12.5000 mg | INTRAMUSCULAR | Status: DC | PRN
  Administered 2018-07-18: 16:00:00 12.5 mg via INTRAVENOUS
  Filled 2018-07-18 (×2): qty 1

## 2018-07-18 MED ORDER — MEASLES, MUMPS & RUBELLA VAC IJ SOLR: 0.5000 mL | Freq: Once | INTRAMUSCULAR | Status: DC

## 2018-07-18 MED ORDER — MORPHINE SULFATE (PF) 0.5 MG/ML IJ SOLN
INTRAMUSCULAR | Status: DC | PRN
  Administered 2018-07-18: 2 mg via EPIDURAL

## 2018-07-18 MED ORDER — LACTATED RINGERS IV BOLUS
1000.0000 mL | Freq: Once | INTRAVENOUS | Status: AC
  Administered 2018-07-18: 02:00:00 1000 mL via INTRAVENOUS

## 2018-07-18 MED ORDER — SODIUM CHLORIDE 0.9 % IV SOLN
INTRAVENOUS | Status: DC | PRN
  Administered 2018-07-18: 12:00:00 40 [IU] via INTRAVENOUS

## 2018-07-18 MED ORDER — PHENYLEPHRINE 40 MCG/ML (10ML) SYRINGE FOR IV PUSH (FOR BLOOD PRESSURE SUPPORT)
PREFILLED_SYRINGE | INTRAVENOUS | Status: AC
  Filled 2018-07-18: qty 10

## 2018-07-18 MED ORDER — SODIUM CHLORIDE (PF) 0.9 % IJ SOLN
INTRAMUSCULAR | Status: DC | PRN
  Administered 2018-07-18: 12 mL/h via EPIDURAL

## 2018-07-18 MED ORDER — NALBUPHINE HCL 10 MG/ML IJ SOLN
5.0000 mg | INTRAMUSCULAR | Status: DC | PRN
  Administered 2018-07-18 – 2018-07-19 (×4): 5 mg via SUBCUTANEOUS
  Filled 2018-07-18 (×3): qty 1

## 2018-07-18 MED ORDER — CALCIUM CARBONATE ANTACID 500 MG PO CHEW: 2.0000 | CHEWABLE_TABLET | ORAL | Status: DC | PRN

## 2018-07-18 MED ORDER — SCOPOLAMINE 1 MG/3DAYS TD PT72
MEDICATED_PATCH | TRANSDERMAL | Status: AC
  Filled 2018-07-18: qty 1

## 2018-07-18 MED ORDER — MORPHINE SULFATE (PF) 0.5 MG/ML IJ SOLN
INTRAMUSCULAR | Status: AC
  Filled 2018-07-18: qty 10

## 2018-07-18 MED ORDER — ONDANSETRON HCL 4 MG/2ML IJ SOLN: 4.0000 mg | Freq: Once | INTRAMUSCULAR | Status: DC | PRN

## 2018-07-18 MED ORDER — LIDOCAINE HCL (PF) 1 % IJ SOLN: 30.0000 mL | INTRAMUSCULAR | Status: DC | PRN

## 2018-07-18 MED ORDER — CYCLOBENZAPRINE HCL 10 MG PO TABS
10.0000 mg | ORAL_TABLET | Freq: Three times a day (TID) | ORAL | Status: DC | PRN
  Filled 2018-07-18: qty 1

## 2018-07-18 MED ORDER — LACTATED RINGERS IV SOLN
INTRAVENOUS | Status: DC
  Administered 2018-07-18 – 2018-07-19 (×2): via INTRAVENOUS

## 2018-07-18 MED ORDER — LACTATED RINGERS IV SOLN
INTRAVENOUS | Status: DC | PRN
  Administered 2018-07-18 (×2): via INTRAVENOUS

## 2018-07-18 MED ORDER — TETANUS-DIPHTH-ACELL PERTUSSIS 5-2.5-18.5 LF-MCG/0.5 IM SUSP: 0.5000 mL | Freq: Once | INTRAMUSCULAR | Status: DC

## 2018-07-18 MED ORDER — COCONUT OIL OIL: 1.0000 "application " | TOPICAL_OIL | Status: DC | PRN

## 2018-07-18 MED ORDER — OXYCODONE-ACETAMINOPHEN 5-325 MG PO TABS: 2.0000 | ORAL_TABLET | ORAL | Status: DC | PRN

## 2018-07-18 MED ORDER — NALBUPHINE HCL 10 MG/ML IJ SOLN
10.0000 mg | INTRAMUSCULAR | Status: DC | PRN
  Administered 2018-07-18 (×2): 10 mg via INTRAVENOUS
  Filled 2018-07-18 (×2): qty 1

## 2018-07-18 MED ORDER — OXYCODONE-ACETAMINOPHEN 5-325 MG PO TABS: 1.0000 | ORAL_TABLET | ORAL | Status: DC | PRN

## 2018-07-18 MED ORDER — SODIUM CHLORIDE 0.9 % IV SOLN
INTRAVENOUS | Status: DC | PRN
  Administered 2018-07-18: 12:00:00 via INTRAVENOUS

## 2018-07-18 MED ORDER — KETOROLAC TROMETHAMINE 30 MG/ML IJ SOLN
INTRAMUSCULAR | Status: AC
  Filled 2018-07-18: qty 1

## 2018-07-18 MED ORDER — FENTANYL CITRATE (PF) 100 MCG/2ML IJ SOLN
INTRAMUSCULAR | Status: AC
  Filled 2018-07-18: qty 2

## 2018-07-18 MED ORDER — OXYCODONE HCL 5 MG/5ML PO SOLN: 5.0000 mg | Freq: Once | ORAL | Status: DC | PRN

## 2018-07-18 MED ORDER — FENTANYL-BUPIVACAINE-NACL 0.5-0.125-0.9 MG/250ML-% EP SOLN
12.0000 mL/h | EPIDURAL | Status: DC | PRN
  Filled 2018-07-18: qty 250

## 2018-07-18 MED ORDER — PHENYLEPHRINE 40 MCG/ML (10ML) SYRINGE FOR IV PUSH (FOR BLOOD PRESSURE SUPPORT)
PREFILLED_SYRINGE | INTRAVENOUS | Status: DC | PRN
  Administered 2018-07-18 (×4): 40 ug via INTRAVENOUS

## 2018-07-18 MED ORDER — ONDANSETRON HCL 4 MG/2ML IJ SOLN
INTRAMUSCULAR | Status: AC
  Filled 2018-07-18: qty 2

## 2018-07-18 MED ORDER — TRAMADOL HCL 50 MG PO TABS: 50.0000 mg | ORAL_TABLET | Freq: Four times a day (QID) | ORAL | Status: DC | PRN

## 2018-07-18 MED ORDER — NALBUPHINE HCL 10 MG/ML IJ SOLN: 5.0000 mg | INTRAMUSCULAR | Status: DC | PRN

## 2018-07-18 MED ORDER — LACTATED RINGERS IV SOLN: 500.0000 mL | INTRAVENOUS | Status: DC | PRN

## 2018-07-18 MED ORDER — SODIUM BICARBONATE 8.4 % IV SOLN
INTRAVENOUS | Status: AC
  Filled 2018-07-18: qty 50

## 2018-07-18 MED ORDER — OXYCODONE-ACETAMINOPHEN 5-325 MG PO TABS
2.0000 | ORAL_TABLET | ORAL | Status: DC | PRN
  Administered 2018-07-19 – 2018-07-21 (×9): 2 via ORAL
  Filled 2018-07-18 (×8): qty 2

## 2018-07-18 MED ORDER — DOCUSATE SODIUM 100 MG PO CAPS
100.0000 mg | ORAL_CAPSULE | Freq: Every day | ORAL | Status: DC
  Filled 2018-07-18: qty 1

## 2018-07-18 MED ORDER — ONDANSETRON HCL 4 MG/2ML IJ SOLN: 4.0000 mg | Freq: Three times a day (TID) | INTRAMUSCULAR | Status: DC | PRN

## 2018-07-18 MED ORDER — MENTHOL 3 MG MT LOZG: 1.0000 | LOZENGE | OROMUCOSAL | Status: DC | PRN

## 2018-07-18 MED ORDER — SODIUM BICARBONATE 8.4 % IV SOLN
INTRAVENOUS | Status: DC | PRN
  Administered 2018-07-18: 7 mL via EPIDURAL
  Administered 2018-07-18: 3 mL via EPIDURAL

## 2018-07-18 MED ORDER — SIMETHICONE 80 MG PO CHEW
80.0000 mg | CHEWABLE_TABLET | ORAL | Status: DC | PRN
  Administered 2018-07-20: 09:00:00 80 mg via ORAL
  Filled 2018-07-18: qty 1

## 2018-07-18 MED ORDER — MAGNESIUM SULFATE BOLUS VIA INFUSION
6.0000 g | Freq: Once | INTRAVENOUS | Status: AC
  Administered 2018-07-18: 05:00:00 6 g via INTRAVENOUS
  Filled 2018-07-18: qty 500

## 2018-07-18 MED ORDER — ALBUTEROL SULFATE (2.5 MG/3ML) 0.083% IN NEBU
2.5000 mg | INHALATION_SOLUTION | RESPIRATORY_TRACT | Status: DC | PRN
  Administered 2018-07-18 – 2018-07-19 (×2): 2.5 mg via RESPIRATORY_TRACT
  Filled 2018-07-18 (×2): qty 3

## 2018-07-18 MED ORDER — LIDOCAINE-EPINEPHRINE (PF) 2 %-1:200000 IJ SOLN
INTRAMUSCULAR | Status: DC | PRN
  Administered 2018-07-18: 3 mL via EPIDURAL
  Administered 2018-07-18 (×2): 1 mL via EPIDURAL

## 2018-07-18 MED ORDER — SENNOSIDES-DOCUSATE SODIUM 8.6-50 MG PO TABS
2.0000 | ORAL_TABLET | ORAL | Status: DC
  Administered 2018-07-19 – 2018-07-21 (×3): 2 via ORAL
  Filled 2018-07-18 (×3): qty 2

## 2018-07-18 MED ORDER — EPHEDRINE 5 MG/ML INJ: 10.0000 mg | INTRAVENOUS | Status: DC | PRN

## 2018-07-18 MED ORDER — ONDANSETRON HCL 4 MG/2ML IJ SOLN
INTRAMUSCULAR | Status: DC | PRN
  Administered 2018-07-18: 4 mg via INTRAVENOUS

## 2018-07-18 MED ORDER — DEXTROSE 5 % IV SOLN
3.0000 g | INTRAVENOUS | Status: AC
  Administered 2018-07-18: 12:00:00 3 g via INTRAVENOUS
  Filled 2018-07-18: qty 3000

## 2018-07-18 MED ORDER — DIPHENHYDRAMINE HCL 50 MG/ML IJ SOLN: 12.5000 mg | INTRAMUSCULAR | Status: DC | PRN

## 2018-07-18 MED ORDER — TRANEXAMIC ACID-NACL 1000-0.7 MG/100ML-% IV SOLN
1000.0000 mg | INTRAVENOUS | Status: AC
  Administered 2018-07-18: 12:00:00 1000 mg via INTRAVENOUS

## 2018-07-18 MED ORDER — OXYCODONE-ACETAMINOPHEN 5-325 MG PO TABS
1.0000 | ORAL_TABLET | ORAL | Status: DC | PRN
  Administered 2018-07-18 – 2018-07-20 (×3): 1 via ORAL
  Filled 2018-07-18 (×6): qty 1

## 2018-07-18 MED ORDER — DIBUCAINE (PERIANAL) 1 % EX OINT: 1.0000 "application " | TOPICAL_OINTMENT | CUTANEOUS | Status: DC | PRN

## 2018-07-18 MED ORDER — GABAPENTIN 100 MG PO CAPS
300.0000 mg | ORAL_CAPSULE | Freq: Two times a day (BID) | ORAL | Status: DC
  Administered 2018-07-18 – 2018-07-20 (×5): 300 mg via ORAL
  Filled 2018-07-18 (×6): qty 3

## 2018-07-18 MED ORDER — PRENATAL MULTIVITAMIN CH
1.0000 | ORAL_TABLET | Freq: Every day | ORAL | Status: DC
  Filled 2018-07-18: qty 1

## 2018-07-18 MED ORDER — MAGNESIUM HYDROXIDE 400 MG/5ML PO SUSP: 30.0000 mL | ORAL | Status: DC | PRN

## 2018-07-18 MED ORDER — OXYTOCIN 40 UNITS IN NORMAL SALINE INFUSION - SIMPLE MED
INTRAVENOUS | Status: AC
  Filled 2018-07-18: qty 1000

## 2018-07-18 MED ORDER — NALBUPHINE HCL 10 MG/ML IJ SOLN: 5.0000 mg | Freq: Once | INTRAMUSCULAR | Status: DC | PRN

## 2018-07-18 MED ORDER — SIMETHICONE 80 MG PO CHEW
80.0000 mg | CHEWABLE_TABLET | ORAL | Status: DC
  Administered 2018-07-19 – 2018-07-21 (×3): 80 mg via ORAL
  Filled 2018-07-18 (×3): qty 1

## 2018-07-18 MED ORDER — IBUPROFEN 800 MG PO TABS
800.0000 mg | ORAL_TABLET | Freq: Four times a day (QID) | ORAL | Status: DC
  Administered 2018-07-19 – 2018-07-21 (×7): 800 mg via ORAL
  Filled 2018-07-18 (×7): qty 1

## 2018-07-18 MED ORDER — ZOLPIDEM TARTRATE 5 MG PO TABS
5.0000 mg | ORAL_TABLET | Freq: Every evening | ORAL | Status: DC | PRN
  Administered 2018-07-20 (×2): 5 mg via ORAL
  Filled 2018-07-18 (×2): qty 1

## 2018-07-18 MED ORDER — ACETAMINOPHEN 325 MG PO TABS
650.0000 mg | ORAL_TABLET | ORAL | Status: DC | PRN
  Administered 2018-07-18: 07:00:00 650 mg via ORAL
  Filled 2018-07-18: qty 2

## 2018-07-18 MED ORDER — LACTATED RINGERS IV BOLUS: 1000.0000 mL | Freq: Once | INTRAVENOUS | Status: DC

## 2018-07-18 MED ORDER — OXYTOCIN 40 UNITS IN NORMAL SALINE INFUSION - SIMPLE MED: 2.5000 [IU]/h | INTRAVENOUS | Status: AC

## 2018-07-18 MED ORDER — LIDOCAINE HCL (PF) 1 % IJ SOLN
INTRAMUSCULAR | Status: DC | PRN
  Administered 2018-07-18: 5 mL via EPIDURAL

## 2018-07-18 MED ORDER — PROMETHAZINE HCL 25 MG/ML IJ SOLN
12.5000 mg | Freq: Once | INTRAMUSCULAR | Status: AC
  Administered 2018-07-18: 02:00:00 12.5 mg via INTRAVENOUS
  Filled 2018-07-18: qty 1

## 2018-07-18 MED ORDER — DIPHENHYDRAMINE HCL 25 MG PO CAPS
25.0000 mg | ORAL_CAPSULE | Freq: Four times a day (QID) | ORAL | Status: DC | PRN
  Administered 2018-07-20: 06:00:00 25 mg via ORAL
  Filled 2018-07-18: qty 1

## 2018-07-18 MED ORDER — SOD CITRATE-CITRIC ACID 500-334 MG/5ML PO SOLN
30.0000 mL | ORAL | Status: DC | PRN
  Administered 2018-07-18: 30 mL via ORAL
  Filled 2018-07-18: qty 30

## 2018-07-18 MED ORDER — FENTANYL CITRATE (PF) 100 MCG/2ML IJ SOLN: 25.0000 ug | INTRAMUSCULAR | Status: DC | PRN

## 2018-07-18 MED ORDER — OXYCODONE HCL 5 MG PO TABS: 5.0000 mg | ORAL_TABLET | Freq: Once | ORAL | Status: DC | PRN

## 2018-07-18 MED ORDER — FENTANYL CITRATE (PF) 100 MCG/2ML IJ SOLN
INTRAMUSCULAR | Status: DC | PRN
  Administered 2018-07-18: 100 ug via EPIDURAL

## 2018-07-18 MED ORDER — NALOXONE HCL 0.4 MG/ML IJ SOLN: 0.4000 mg | INTRAMUSCULAR | Status: DC | PRN

## 2018-07-18 MED ORDER — ENOXAPARIN SODIUM 60 MG/0.6ML ~~LOC~~ SOLN
60.0000 mg | SUBCUTANEOUS | Status: DC
  Administered 2018-07-19 – 2018-07-21 (×2): 60 mg via SUBCUTANEOUS
  Filled 2018-07-18 (×3): qty 0.6

## 2018-07-18 MED ORDER — MEPERIDINE HCL 25 MG/ML IJ SOLN: 6.2500 mg | INTRAMUSCULAR | Status: DC | PRN

## 2018-07-18 MED ORDER — ZOLPIDEM TARTRATE 5 MG PO TABS: 5.0000 mg | ORAL_TABLET | Freq: Every evening | ORAL | Status: DC | PRN

## 2018-07-18 MED ORDER — OXYTOCIN BOLUS FROM INFUSION: 500.0000 mL | Freq: Once | INTRAVENOUS | Status: DC

## 2018-07-18 MED ORDER — KETOROLAC TROMETHAMINE 30 MG/ML IJ SOLN
30.0000 mg | Freq: Four times a day (QID) | INTRAMUSCULAR | Status: AC
  Administered 2018-07-18 – 2018-07-19 (×4): 30 mg via INTRAVENOUS
  Filled 2018-07-18 (×4): qty 1

## 2018-07-18 MED ORDER — LACTATED RINGERS IV SOLN: INTRAVENOUS | Status: DC

## 2018-07-18 MED ORDER — OXYTOCIN 40 UNITS IN NORMAL SALINE INFUSION - SIMPLE MED: 2.5000 [IU]/h | INTRAVENOUS | Status: DC

## 2018-07-18 MED ORDER — ACETAMINOPHEN 160 MG/5ML PO SOLN: 325.0000 mg | ORAL | Status: DC | PRN

## 2018-07-18 MED ORDER — ONDANSETRON HCL 4 MG/2ML IJ SOLN: 4.0000 mg | Freq: Four times a day (QID) | INTRAMUSCULAR | Status: DC | PRN

## 2018-07-18 MED ORDER — HYDROMORPHONE HCL 1 MG/ML IJ SOLN: 1.0000 mg | INTRAMUSCULAR | Status: DC | PRN

## 2018-07-18 MED ORDER — MAGNESIUM SULFATE 40 G IN LACTATED RINGERS - SIMPLE
3.0000 g/h | INTRAVENOUS | Status: DC
  Filled 2018-07-18 (×3): qty 500

## 2018-07-18 MED ORDER — DIPHENHYDRAMINE HCL 25 MG PO CAPS: 25.0000 mg | ORAL_CAPSULE | ORAL | Status: DC | PRN

## 2018-07-18 MED ORDER — FERROUS SULFATE 325 (65 FE) MG PO TABS
325.0000 mg | ORAL_TABLET | Freq: Two times a day (BID) | ORAL | Status: DC
  Administered 2018-07-19 – 2018-07-21 (×5): 325 mg via ORAL
  Filled 2018-07-18 (×5): qty 1

## 2018-07-18 MED ORDER — ACETAMINOPHEN 325 MG PO TABS: 650.0000 mg | ORAL_TABLET | ORAL | Status: DC | PRN

## 2018-07-18 MED ORDER — KETOROLAC TROMETHAMINE 30 MG/ML IJ SOLN
30.0000 mg | Freq: Once | INTRAMUSCULAR | Status: AC | PRN
  Administered 2018-07-18: 14:00:00 30 mg via INTRAVENOUS

## 2018-07-18 MED ORDER — PRENATAL MULTIVITAMIN CH
1.0000 | ORAL_TABLET | Freq: Every day | ORAL | Status: DC
  Administered 2018-07-19 – 2018-07-20 (×2): 1 via ORAL
  Filled 2018-07-18 (×2): qty 1

## 2018-07-18 SURGICAL SUPPLY — 45 items
CHLORAPREP W/TINT 26ML (MISCELLANEOUS) ×3 IMPLANT
CLAMP CORD UMBIL (MISCELLANEOUS) IMPLANT
CLOTH BEACON ORANGE TIMEOUT ST (SAFETY) ×3 IMPLANT
DERMABOND ADVANCED (GAUZE/BANDAGES/DRESSINGS) ×4
DERMABOND ADVANCED .7 DNX12 (GAUZE/BANDAGES/DRESSINGS) ×2 IMPLANT
DRSG OPSITE POSTOP 4X10 (GAUZE/BANDAGES/DRESSINGS) ×3 IMPLANT
DRSG PAD ABDOMINAL 8X10 ST (GAUZE/BANDAGES/DRESSINGS) ×3 IMPLANT
ELECT REM PT RETURN 9FT ADLT (ELECTROSURGICAL) ×3
ELECTRODE REM PT RTRN 9FT ADLT (ELECTROSURGICAL) ×1 IMPLANT
EXTRACTOR VACUUM BELL STYLE (SUCTIONS) IMPLANT
GAUZE SPONGE 4X4 3PLY NS LF (GAUZE/BANDAGES/DRESSINGS) ×6 IMPLANT
GLOVE BIOGEL PI IND STRL 7.0 (GLOVE) ×1 IMPLANT
GLOVE BIOGEL PI IND STRL 8 (GLOVE) ×1 IMPLANT
GLOVE BIOGEL PI INDICATOR 7.0 (GLOVE) ×2
GLOVE BIOGEL PI INDICATOR 8 (GLOVE) ×2
GLOVE ECLIPSE 8.0 STRL XLNG CF (GLOVE) ×3 IMPLANT
GOWN STRL REUS W/TWL LRG LVL3 (GOWN DISPOSABLE) ×6 IMPLANT
KIT ABG SYR 3ML LUER SLIP (SYRINGE) ×3 IMPLANT
NEEDLE HYPO 18GX1.5 BLUNT FILL (NEEDLE) ×3 IMPLANT
NEEDLE HYPO 22GX1.5 SAFETY (NEEDLE) ×3 IMPLANT
NEEDLE HYPO 25X5/8 SAFETYGLIDE (NEEDLE) ×3 IMPLANT
NS IRRIG 1000ML POUR BTL (IV SOLUTION) ×3 IMPLANT
PACK C SECTION WH (CUSTOM PROCEDURE TRAY) ×3 IMPLANT
PAD ABD 7.5X8 STRL (GAUZE/BANDAGES/DRESSINGS) ×3 IMPLANT
PAD ABD DERMACEA PRESS 5X9 (GAUZE/BANDAGES/DRESSINGS) ×3 IMPLANT
PAD OB MATERNITY 4.3X12.25 (PERSONAL CARE ITEMS) ×3 IMPLANT
PENCIL SMOKE EVAC W/HOLSTER (ELECTROSURGICAL) ×3 IMPLANT
RETRACTOR TRAXI PANNICULUS (MISCELLANEOUS) ×1 IMPLANT
RTRCTR C-SECT PINK 25CM LRG (MISCELLANEOUS) ×3 IMPLANT
SUT CHROMIC 0 CT 1 (SUTURE) ×3 IMPLANT
SUT MNCRL 0 VIOLET CTX 36 (SUTURE) ×2 IMPLANT
SUT MONOCRYL 0 CTX 36 (SUTURE) ×4
SUT PLAIN 2 0 (SUTURE)
SUT PLAIN 2 0 XLH (SUTURE) IMPLANT
SUT PLAIN ABS 2-0 CT1 27XMFL (SUTURE) IMPLANT
SUT VIC AB 0 CTX 36 (SUTURE) ×2
SUT VIC AB 0 CTX36XBRD ANBCTRL (SUTURE) ×1 IMPLANT
SUT VIC AB 2-0 CT1 27 (SUTURE) ×2
SUT VIC AB 2-0 CT1 TAPERPNT 27 (SUTURE) ×1 IMPLANT
SUT VIC AB 4-0 KS 27 (SUTURE) IMPLANT
SYR 20CC LL (SYRINGE) ×6 IMPLANT
TOWEL OR 17X24 6PK STRL BLUE (TOWEL DISPOSABLE) ×3 IMPLANT
TRAXI PANNICULUS RETRACTOR (MISCELLANEOUS) ×2
TRAY FOLEY W/BAG SLVR 14FR LF (SET/KITS/TRAYS/PACK) IMPLANT
WATER STERILE IRR 1000ML POUR (IV SOLUTION) ×3 IMPLANT

## 2018-07-18 NOTE — MAU Note (Signed)
Pt reports they began at 9:30 pm 07/17/18, 8 minutes apart at first 6/10, at 2300 5-6 min apart 8-9/10, pt reports discomfort. Pt denies water breaking, loss of mucous plug. Pt scheduled for C-section on 6/14. Pt reports last fetal movement 5/24 at 1900 and decreased movement this evening.

## 2018-07-18 NOTE — Anesthesia Preprocedure Evaluation (Addendum)
Anesthesia Evaluation  Patient identified by MRN, date of birth, ID band Patient awake    Reviewed: Allergy & Precautions, H&P , NPO status , Patient's Chart, lab work & pertinent test results  Airway Mallampati: III  TM Distance: >3 FB Neck ROM: Full    Dental no notable dental hx.    Pulmonary asthma , Current Smoker,    Pulmonary exam normal breath sounds clear to auscultation       Cardiovascular Exercise Tolerance: Good hypertension, Normal cardiovascular exam Rhythm:Regular Rate:Normal     Neuro/Psych  Headaches, Anxiety    GI/Hepatic   Endo/Other  Morbid obesity  Renal/GU      Musculoskeletal   Abdominal (+) + obese,   Peds  Hematology Hgb 11.5 Plt 275   Anesthesia Other Findings   Reproductive/Obstetrics (+) Pregnancy                            Anesthesia Physical Anesthesia Plan  ASA: III  Anesthesia Plan: Epidural   Post-op Pain Management:    Induction:   PONV Risk Score and Plan: Treatment may vary due to age or medical condition  Airway Management Planned:   Additional Equipment:   Intra-op Plan:   Post-operative Plan:   Informed Consent: I have reviewed the patients History and Physical, chart, labs and discussed the procedure including the risks, benefits and alternatives for the proposed anesthesia with the patient or authorized representative who has indicated his/her understanding and acceptance.       Plan Discussed with:   Anesthesia Plan Comments: (LEA for Preterm labor)        Anesthesia Quick Evaluation

## 2018-07-18 NOTE — MAU Provider Note (Signed)
Patient Joanne Hubbard is a 28 y.o. [id number]; At  [redacted]w[redacted]d here with complaints of contractions that started at 9 pm. She denies LOF, VB, decreased fetal movements.  History     CSN: [id number]  Arrival date and time: 07/18/18 0011   None     Chief Complaint  Patient presents with  . Contractions   Abdominal Pain     OB History    Gravida  7   Para  4   Term  3   Preterm  1   AB  2   Living  4     SAB  1   TAB  1   Ectopic      Multiple      Live Births  4           Past Medical History:  Diagnosis Date  . Anxiety   . Asthma    Resoved as child  . Chlamydia   . Depression     Past Surgical History:  Procedure Laterality Date  . CESAREAN SECTION    . CHOLECYSTECTOMY    . uterine rupture      Family History  Adopted: Yes  Family history unknown: Yes    Social History   Tobacco Use  . Smoking status: Current Every Day Smoker    Packs/day: 0.25    Years: 7.00    Pack years: 1.75    Types: Cigarettes  . Smokeless tobacco: Never Used  Substance Use Topics  . Alcohol use: Not Currently    Comment: drank on 12-25-17  . Drug use: Not Currently    Allergies:  Allergies  Allergen Reactions  . Clonopin [Clonazepam]     "makes me more suicidal"    Medications Prior to Admission  Medication Sig Dispense Refill Last Dose  . acetaminophen (TYLENOL) 500 MG tablet Take 1,000 mg by mouth every 6 (six) hours as needed. prn tooth ache     . metoCLOPramide (REGLAN) 10 MG tablet Take 1 tablet (10 mg total) by mouth 3 (three) times daily as needed for nausea. 20 tablet 1   . Prenatal Vit-Fe Fumarate-FA (PRENATAL PLUS/IRON) 27-1 MG TABS Take 1 tablet by mouth daily. 30 each 12 Not Taking  . terconazole (TERAZOL 7) 0.4 % vaginal cream Place 1 applicator vaginally at bedtime. 45 g 0 Not Taking  . valACYclovir (VALTREX) 500 MG tablet Take 1 tablet (500 mg total) by mouth 2 (two) times daily. 60 tablet 3 Not Taking  . cyclobenzaprine (FLEXERIL) 10 MG  tablet Take 1 tablet (10 mg total) by mouth 3 (three) times daily as needed for muscle spasms. 30 tablet 1   . Prenat-Fe Carbonyl-FA-Omega 3 (ONE-A-DAY WOMENS PRENATAL 1) 28-0.8-235 MG CAPS Take 1 capsule by mouth daily. 25 capsule 0 Taking    Review of Systems  Constitutional: Negative.   HENT: Negative.   Respiratory: Negative.   Cardiovascular: Negative.   Gastrointestinal: Positive for abdominal pain.  Neurological: Negative.   Psychiatric/Behavioral: Negative.    Physical Exam   Blood pressure (!) 98/59, pulse 84, temperature 98.8 F (37.1 C), temperature source Oral, resp. rate 18, height 5\' 3"  (1.6 m), weight 124.3 kg, last menstrual period 11/21/2017.  Physical Exam  Constitutional: She is oriented to person, place, and time. She appears well-developed and well-nourished.  HENT:  Head: Normocephalic.  Neck: Normal range of motion.  Respiratory: Effort normal.  Genitourinary:    Vagina normal.   Neurological: She is alert and oriented to person, place, and time.  Skin: Skin is warm and dry.  Cervix is thin, bulging bag, 4.5 cm at 0500.   MAU Course  Procedures  MDM  0040: Labor team notified immediately of patient's cervical exam and complaint; will monitor to ascertain if cervical change and pain level; patient is very comfortable between contractions -patient now slightly more comfortable after 2 doses of procardia; 3rd dose held due to low diastolic.  -Will give phenergan and nubain for pain.  Patient reported that she "felt much better" when she thought RN had pushed medicine; however, RN had not yet administered the pain medicine. Shortly after receiving her medicine, her pain immediately became a 5/10, was observed sleeping in room at 0300. Now watching TV.  -2 L of fluid given, will give 125 ml/hour now.  -Patient last ate at 10 pm; will keep NPO until morning -SARS-2 test negative, patient is asymptomatic.  -Cervix is slightly more dilated, now 4.5; still with  bulging bag and 80%.  -wet prep and GC sent -NST: 135 bpm, mod var, present acel, neg decels, ctx are now more spaced out at q 5 min.  Assessment and Plan  -admit to L and D as OB-Speciality care is full.  -0430: Discussed with Dr. Despina HiddenEure, who recommends that patient be admitted and given MgSo4 6 gram loading dose and 3 gram bolus for tocolysis, plus bmz.  -NICU aware of patient's possible delivery.   Charlesetta GaribaldiKathryn Lorraine Kooistra 07/18/2018, 3:16 AM

## 2018-07-18 NOTE — Progress Notes (Signed)
CSW spoke with MOB at bedside to confirm birth plan, at this time, mom is uncertain about proceeding with adoption and agreed to follow up with CSW after delivery and after magnesium has been discontinued. CSW will continue to follow.  Archie Balboa, LCSWA  [facility] [phone]

## 2018-07-18 NOTE — Anesthesia Procedure Notes (Addendum)
Epidural Patient location during procedure: OB Start time: 07/18/2018 10:15 AM End time: 07/18/2018 10:30 AM  Staffing Anesthesiologist: Trevor Iha, MD Performed: anesthesiologist   Preanesthetic Checklist Completed: patient identified, site marked, surgical consent, pre-op evaluation, timeout performed, IV checked, risks and benefits discussed and monitors and equipment checked  Epidural Patient position: sitting Prep: site prepped and draped and DuraPrep Patient monitoring: continuous pulse ox and blood pressure Approach: midline Location: L3-L4 Injection technique: LOR air  Needle:  Needle type: Tuohy  Needle gauge: 17 G Needle length: 9 cm and 9 Needle insertion depth: 8 cm Catheter type: closed end flexible Catheter size: 19 Gauge Catheter at skin depth: 13 cm Test dose: negative  Assessment Events: blood not aspirated, injection not painful, no injection resistance, negative IV test and no paresthesia  Additional Notes Patient identified. Risks/Benefits/Options discussed with patient including but not limited to bleeding, infection, nerve damage, paralysis, failed block, incomplete pain control, headache, blood pressure changes, nausea, vomiting, reactions to medication both or allergic, itching and postpartum back pain. Confirmed with bedside nurse the patient's most recent platelet count. Confirmed with patient that they are not currently taking any anticoagulation, have any bleeding history or any family history of bleeding disorders. Patient expressed understanding and wished to proceed. All questions were answered. Sterile technique was used throughout the entire procedure. Please see nursing notes for vital signs. Test dose was given through epidural needle and negative prior to continuing to dose epidural or start infusion. Warning signs of high block given to the patient including shortness of breath, tingling/numbness in hands, complete motor block, or any  concerning symptoms with instructions to call for help. Patient was given instructions on fall risk and not to get out of bed. All questions and concerns addressed with instructions to call with any issues.  1 Attempt (S) . Patient tolerated procedure well.

## 2018-07-18 NOTE — H&P (Signed)
ADMISSION HISTORY AND PHYSICAL NOTE  Joanne Hubbard is a 28 y.o. female [phone] with IUP at [redacted]w[redacted]d by LMP c/w 19 week sono presenting for contractions that began at 2100 on 5/24. Patient given procardia and 2L IVF with MIVF in MAU. Also given pain control medications. Cervix changed slightly over a 4 hour period and contractions spaced out. Dr. Despina Hidden recommended admission for Mg tocolysis and BMZ.  She reports positive fetal movement. She denies leakage of fluid or vaginal bleeding.  Prenatal History/Complications: PNC at [facility]  Pregnancy complications:  - limited PNC  -h/o VBAC after C-section with subsequent uterine rupture>>recommended to have C-section at 37wk per MFM  -BUFA   Past Medical History: Past Medical History:  Diagnosis Date  . Anxiety   . Asthma    Resoved as child  . Chlamydia   . Depression     Past Surgical History: Past Surgical History:  Procedure Laterality Date  . CESAREAN SECTION    . CHOLECYSTECTOMY    . uterine rupture      Obstetrical History: OB History    Gravida  7   Para  4   Term  3   Preterm  1   AB  2   Living  4     SAB  1   TAB  1   Ectopic      Multiple      Live Births  4           Social History: Social History   Socioeconomic History  . Marital status: Single    Spouse name: Not on file  . Number of children: 4  . Years of education: Not on file  . Highest education level: 11th grade  Occupational History  . Occupation: Unemployed  Social Needs  . Financial resource strain: Hard  . Food insecurity:    Worry: Sometimes true    Inability: Sometimes true  . Transportation needs:    Medical: Yes    Non-medical: Yes  Tobacco Use  . Smoking status: Current Every Day Smoker    Packs/day: 0.25    Years: 7.00    Pack years: 1.75    Types: Cigarettes  . Smokeless tobacco: Never Used  Substance and Sexual Activity  . Alcohol use: Not Currently    Comment: drank on 12-25-17  . Drug use: Not Currently  .  Sexual activity: Yes    Birth control/protection: None  Lifestyle  . Physical activity:    Days per week: 0 days    Minutes per session: 0 min  . Stress: To some extent  Relationships  . Social connections:    Talks on phone: Twice a week    Gets together: Never    Attends religious service: Never    Active member of club or organization: No    Attends meetings of clubs or organizations: Never    Relationship status: Living with partner  Other Topics Concern  . Not on file  Social History Narrative   Behavior referral placed to [facility] for social issues. Patient also need a referral for social worker.    Family History: Family History  Adopted: Yes  Family history unknown: Yes    Allergies: Allergies  Allergen Reactions  . Clonopin [Clonazepam]     "makes me more suicidal"    Medications Prior to Admission  Medication Sig Dispense Refill Last Dose  . acetaminophen (TYLENOL) 500 MG tablet Take 1,000 mg by mouth every 6 (six) hours as needed. prn tooth  ache     . metoCLOPramide (REGLAN) 10 MG tablet Take 1 tablet (10 mg total) by mouth 3 (three) times daily as needed for nausea. 20 tablet 1   . Prenatal Vit-Fe Fumarate-FA (PRENATAL PLUS/IRON) 27-1 MG TABS Take 1 tablet by mouth daily. 30 each 12 Not Taking  . terconazole (TERAZOL 7) 0.4 % vaginal cream Place 1 applicator vaginally at bedtime. 45 g 0 Not Taking  . valACYclovir (VALTREX) 500 MG tablet Take 1 tablet (500 mg total) by mouth 2 (two) times daily. 60 tablet 3 Not Taking  . cyclobenzaprine (FLEXERIL) 10 MG tablet Take 1 tablet (10 mg total) by mouth 3 (three) times daily as needed for muscle spasms. 30 tablet 1   . Prenat-Fe Carbonyl-FA-Omega 3 (ONE-A-DAY WOMENS PRENATAL 1) 28-0.8-235 MG CAPS Take 1 capsule by mouth daily. 25 capsule 0 Taking     Review of Systems  All systems reviewed and negative except as stated in HPI  Physical Exam Blood pressure (!) 109/57, pulse 87, temperature 98.2 F (36.8 C),  temperature source Oral, resp. rate 16, height  (1.6 m), weight 124.3 kg, last menstrual period 11/21/2017, SpO2 97 %. General appearance: alert, oriented, NAD Lungs: normal respiratory effort Heart: regular rate Abdomen: soft, non-tender; gravid, FH appropriate for GA Extremities: No calf swelling or tenderness Presentation: cephalic Fetal monitoring: 125 bpm, moderate variability, +acels, no decels  Uterine activity: Irregular  Dilation: 4.5 Exam by:: Alverda Skeans CNM  Prenatal labs: ABO, Rh: --/--/A POS, A POS Performed at [facility], [address] [place], [place] [address]  [phone]/25 0100) Antibody: NEG (05/25 0100) Rubella: 2.91 (01/21 1110) RPR: Non Reactive (01/21 1110)  HBsAg: Negative (01/21 1110)  HIV: Non Reactive (01/21 1110)  GC/Chlamydia: Pending  GBS:    Unknown  2-hr GTT: Missed appt, A1c pending  Genetic screening:  Low risk  Anatomy US: Normal   Prenatal Transfer Tool  Maternal Diabetes: No Genetic Screening: Normal Maternal Ultrasounds/Referrals: Normal Fetal Ultrasounds or other Referrals:  Referred to Materal Fetal Medicine  Maternal Substance Abuse:  No Significant Maternal Medications:  None Significant Maternal Lab Results: None  Results for orders placed or performed during the hospital encounter of 07/18/18 (from the past 24 hour(s))  Glucose, capillary   Collection Time: 07/18/18 12:50 AM  Result Value Ref Range   Glucose-Capillary 120 (H) 70 - 99 mg/dL  Urinalysis, Complete w Microscopic   Collection Time: 07/18/18 12:56 AM  Result Value Ref Range   Color, Urine YELLOW YELLOW   APPearance CLEAR CLEAR   Specific Gravity, Urine 1.021 1.005 - 1.030   pH 6.0 5.0 - 8.0   Glucose, UA NEGATIVE NEGATIVE mg/dL   Hgb urine dipstick NEGATIVE NEGATIVE   Bilirubin Urine NEGATIVE NEGATIVE   Ketones, ur NEGATIVE NEGATIVE mg/dL   Protein, ur NEGATIVE NEGATIVE mg/dL   Nitrite NEGATIVE NEGATIVE   Leukocytes,Ua NEGATIVE NEGATIVE   RBC  / HPF 0-5 0 - 5 RBC/hpf   WBC, UA 0-5 0 - 5 WBC/hpf   Bacteria, UA RARE (A) NONE SEEN   Squamous Epithelial / LPF 6-10 0 - 5   Mucus PRESENT   CBC   Collection Time: 07/18/18  1:00 AM  Result Value Ref Range   WBC 14.3 (H) 4.0 - 10.5 K/uL   RBC 4.09 3.87 - 5.11 MIL/uL   Hemoglobin 11.5 (L) 12.0 - 15.0 g/dL   HCT 98.1 19.1 - 47.8 %   MCV 88.8 80.0 - 100.0 fL   MCH 28.1  26.0 - 34.0 pg   MCHC 31.7 30.0 - 36.0 g/dL   RDW 16.114.5 09.611.5 - 04.515.5 %   Platelets 275 150 - 400 K/uL   nRBC 0.0 0.0 - 0.2 %  Type and screen [facility]   Collection Time: 07/18/18  1:00 AM  Result Value Ref Range   ABO/RH(D) A POS    Antibody Screen NEG    Sample Expiration      07/21/2018,2359 Performed at [facility], [address] [place], [place] [address]   ABO/Rh   Collection Time: 07/18/18  1:00 AM  Result Value Ref Range   ABO/RH(D)      A POS Performed at [facility], [address] [place], [place] [address]   SARS Coronavirus 2 (CEPHEID - Performed in [facility]), [facility] Order   Collection Time: 07/18/18  1:24 AM  Result Value Ref Range   SARS Coronavirus 2 NEGATIVE NEGATIVE  Wet prep, genital   Collection Time: 07/18/18  4:25 AM  Result Value Ref Range   Yeast Wet Prep HPF POC NONE SEEN NONE SEEN   Trich, Wet Prep NONE SEEN NONE SEEN   Clue Cells Wet Prep HPF POC PRESENT (A) NONE SEEN   WBC, Wet Prep HPF POC FEW (A) NONE SEEN   Sperm NONE SEEN     Patient Active Problem List   Diagnosis Date Noted  . Preterm labor 07/18/2018  . Migraine without aura and without status migrainosus, not intractable 05/06/2018  . Pregnancy headache, antepartum 05/06/2018  . Muscle spasm 05/06/2018  . History of preterm delivery 04/15/2018  . History of C-section 04/15/2018  . Herpes simplex type 2 (HSV-2) infection affecting pregnancy, antepartum 04/15/2018  . Uterine rupture during labor 04/15/2018  . Supervision of high risk pregnancy, antepartum  03/15/2018    Assessment/Plan: Meta HatchetSymone Hubbard is a 28 y.o. [id number] at 8264w1d here for threatened PTL. Patient with h/o uterine rupture after VBAC. Contractions spaced out with tocolysis in MAU; however, patient reported to continue to feel uncomfortable and had small amount of cervical change. Will receive 6g Mg bolus followed by 3g/hour. BMZ ordered. Continue to monitor closely. NICU aware of patient. Patient is not a candidate for TOLAC and will need C-section for delivery route if continues to labor.    De HollingsheadCatherine L Compton Brigance 07/18/2018, 6:56 AM

## 2018-07-18 NOTE — Progress Notes (Signed)
Faculty Practice OB/GYN Attending Note  Subjective:  Called to evaluate patient with continued frequent painful contractions and having cervical change now.  On magnesium sulfate IV 3g/hr, had also received terbutaline prior to my arrival. She has a history of previous uterine rupture.   FHR reassuring, no LOF or vaginal bleeding. Good FM.     Objective:  Blood pressure 111/64, pulse 95, temperature 98.4 F (36.9 C), temperature source Oral, resp. rate 18, height 5\' 3"  (1.6 m), weight 124.3 kg, last menstrual period 11/21/2017, SpO2 95 %. FHT  Baseline 130 bpm, moderate variability, +accelerations, no decelerations Toco: q4-6 minutes now Gen: NAD HENT: Normocephalic, atraumatic Lungs: Normal respiratory effort Heart: Regular rate noted Abdomen: NT gravid fundus, soft, well-healed Pfannensteil and vertical scars Ext: 2+ DTRs, no edema, no cyanosis Cervix: Dilation: 5.5 Effacement (%): 80 Cervical Position: Anterior Station: -2 Presentation: Vertex Exam by:: A. Earna Coder, RN This is change from 4 cm earlier and more effacement   Assessment & Plan:  28 y.o. G3T5176 at [redacted]w[redacted]d with history of previous uterine rupture admitted for preterm labor, now with progressing preterm labor despite being on two different tocolytics.  Repeat cesarean delivery recommended.  The risks of cesarean section were discussed with the patient including but were not limited to: bleeding which may require transfusion or reoperation; infection which may require antibiotics; injury to bowel, bladder, ureters or other surrounding organs; injury to the fetus; need for additional procedures including hysterectomy in the event of a life-threatening hemorrhage; placental abnormalities wth subsequent pregnancies, incisional problems, thromboembolic phenomenon, death and other postoperative/anesthesia complications.  Of note, patient also desires permanent sterilization.  Other reversible forms of contraception were discussed with  patient; she declines all other modalities. Risks of procedure discussed with patient including but not limited to: risk of regret, permanence of method, bleeding, infection, injury to surrounding organs and need for additional procedures.  Failure risk of about 1% with increased risk of ectopic gestation if pregnancy occurs was also discussed with patient.  Also discussed possibility of post-tubal pain syndrome. The patient concurred with the proposed plan, giving informed written consent for the procedures.  Anesthesia and OR aware.  Preoperative prophylactic antibiotics, TXA and SCDs ordered on call to the OR.  Neonatology also informed, patient has Category I FHR tracing and received one dose of betamethasone around 0630 today.  To OR when ready.   Jaynie Collins, MD, FACOG Obstetrician & Gynecologist, [facility] for [employer], [facility]

## 2018-07-18 NOTE — Op Note (Signed)
Meta Hatchet PROCEDURE DATE: 07/18/2018  PREOPERATIVE DIAGNOSES: Intrauterine pregnancy at [redacted]w[redacted]d weeks gestation; progressing preterm labor; history of uterine rupture after previous cesarean section; undesired fertility  POSTOPERATIVE DIAGNOSES: The same; polyhydramnios; infant large for gestational age  PROCEDURE: Repeat Low Transverse Cesarean Section, Bilateral Tubal Sterilization via Pomeroy method  SURGEON:  Dr. Jaynie Collins  ANESTHESIOLOGIST: Dr. Druscilla Brownie  INDICATIONS: Joanne Hubbard is a 28 y.o. [id number] at [redacted]w[redacted]d here for cesarean section and bilateral tubal sterilization secondary to the indications listed under preoperative diagnoses; please see preoperative note for further details.  The risks of surgery were discussed with the patient including but were not limited to: bleeding which may require transfusion or reoperation; infection which may require antibiotics; injury to bowel, bladder, ureters or other surrounding organs; injury to the fetus; need for additional procedures including hysterectomy in the event of a life-threatening hemorrhage; placental abnormalities wth subsequent pregnancies, incisional problems, thromboembolic phenomenon and other postoperative/anesthesia complications.  Patient also desires permanent sterilization.  Other reversible forms of contraception were discussed with patient; she declines all other modalities. Risks of procedure discussed with patient including but not limited to: risk of regret, permanence of method, bleeding, infection, injury to surrounding organs and need for additional procedures.  Failure risk of 1-2% with increased risk of ectopic gestation if pregnancy occurs was also discussed with patient.  Also discussed possibility of post-tubal pain syndrome. The patient concurred with the proposed plan, giving informed written consent for the procedures.    FINDINGS:  Viable female infant in cephalic presentation.  Apgars 7 and 8, weight  8 lb 14 oz which is large for gestational age. Patient has insufficient prenatal care, had no testing for GDM and preoperatively drawn HgA1C was pending at the time of this note.  Copious amount of clear amniotic fluid consistent with polyhydramnios; also concerning for GDM.  Intact placenta, three vessel cord.  Normal uterus, fallopian tubes and ovaries bilaterally. Fallopian tubes were sterilized bilaterally. Minimal intraperitoneal adhesive disease, just had omental adhesion to anterior abdominal wall that ws easily lysed.   ANESTHESIA: Spinal ESTIMATED BLOOD LOSS: 531 ml as per Triton SPECIMENS: Placenta sent to pathology; bilateral fallopian tube fragments also sent to pathology COMPLICATIONS: None immediate  PROCEDURE IN DETAIL:  The patient preoperatively received intravenous antibiotics and had sequential compression devices applied to her lower extremities.   She was then taken to the operating room where spinal anesthesia was administered and was found to be adequate. She was then placed in a dorsal supine position with a leftward tilt, and prepped and draped in a sterile manner.  A foley catheter was placed into her bladder and attached to constant gravity.  After an adequate timeout was performed, a Pfannenstiel skin incision was made with scalpel over her preexisting scar and carried through to the underlying layer of fascia. The fascia was incised in the midline, and this incision was extended bilaterally using the Mayo scissors.  Kocher clamps were applied to the superior aspect of the fascial incision and the underlying rectus muscles were dissected off sharply.  A similar process was carried out on the inferior aspect of the fascial incision. The rectus muscles were separated in the midline and the peritoneum was entered bluntly. The omental adhesion was lysed. The Alexis self-retaining retractor was introduced into the abdominal cavity.  Attention was turned to the lower uterine segment that  was noted to be very thin and bulging with a lot of amniotic fluid.  A low transverse hysterotomy was made  with a scalpel and extended bilaterally bluntly; there was immediate return of over a 1.5 liters of clear fluid.  The unexpectedly large infant was successfully delivered, the cord was clamped and cut after one minute, and the infant was handed over to the awaiting neonatology team. Uterine massage was then administered, and the placenta delivered intact with a three-vessel cord. The uterus was then cleared of clots and debris.  The hysterotomy was closed with 0 Vicryl in a running locked fashion, and an imbricating layer was also placed with 0 Vicryl.  Figure-of-eight 0 Vicryl serosal stitches were placed to help with hemostasis.  Attention was then turned to the fallopian tubes, the Babcock clamp was then used to grasp the left fallopian tube approximately 3 cm from the cornual region. A 2 cm segment of the tube was then doubly ligated with free tie of plain gut suture, transected and excised. Good hemostasis was noted. The right fallopian tube was then identified, doubly ligated, and a 2 cm segment excised in a similar fashion allowing for bilateral tubal sterilization. Excellent hemostasis was noted. The pelvis was cleared of all clot and debris. Hemostasis was confirmed on all surfaces.  The retractor was removed.  The fascia was then closed using 0 PDS in a running fashion.  The subcutaneous layer was irrigated, reapproximated with 2-0 plain gut interrupted stitches, and the skin was closed with a 4-0 Vicryl subcuticular stitch. The patient tolerated the procedure well. Sponge, instrument and needle counts were correct x 3.  She was taken to the recovery room in stable condition.    Jaynie CollinsUGONNA  Glorianna Gott, MD, FACOG Obstetrician & Gynecologist, [facility] for [employer], [facility]

## 2018-07-18 NOTE — Progress Notes (Signed)
Called Dr. Macon Large regarding patient's pain. After providing scheduled neurontin, patient's pain is still a 6 out of 6. No other medication ordered besides narcotics. Dr. Macon Large ordered that I may give percocet now per [facility], Linda Hedges [place]

## 2018-07-18 NOTE — Discharge Summary (Addendum)
Postpartum Discharge Summary     Patient Name: Joanne Hubbard DOB: 08-29-1990 MRN: [id number]  Date of admission: 07/18/2018 Delivering Provider: Jaynie Hubbard, UGONNA A   Date of discharge: 07/21/2018  Admitting diagnosis: 34 WKS, CTX Intrauterine pregnancy: 8843w1d     Secondary diagnosis:  Principal Problem:   S/P cesarean section Active Problems:   Supervision of high risk pregnancy, antepartum   History of preterm delivery   Uterine rupture during labor   Preterm labor   Delivery of infant large for gestational age h/o uterine rupture w/ TOLAC  Additional problems: maternal morbid obesity     Discharge diagnosis: Preterm Pregnancy Delivered                                                                                                Post partum procedures:BTL w/ C/S  Augmentation: n/a  Complications: None  Hospital course: C/S   28 y.o. yo [id number] at 7343w1d was admitted to the hospital 07/18/2018 with progressing PTL, h/o uterine rupture w/ TOLAC, so repeat cesarean section was performed.  Membrane Rupture Time/Date: 12:11 PM ,07/18/2018   Patient delivered a Viable infant. Details of operation can be found in separate operative note.  Pateint had an uncomplicated postpartum course.  She is ambulating, tolerating a regular diet, passing flatus, and urinating well. Patient is discharged home in stable condition on  07/21/18         Magnesium Sulfate recieved: Yes BMZ received: Yes  Physical exam  Vitals:   07/20/18 0600 07/20/18 1421 07/20/18 2222 07/21/18 0607  BP: 102/68 118/70 107/69 109/64  Pulse: 98 79 88 93  Resp: 20 18 18 18   Temp: 98.5 F (36.9 C) 98 F (36.7 C) 99.1 F (37.3 C) 98.9 F (37.2 C)  TempSrc: Oral Oral Oral Oral  SpO2: 98% 100% 98% 100%  Weight:      Height:       General: alert Lochia: appropriate Uterine Fundus: firm Incision: Dressing is clean, dry, and intact DVT Evaluation: No evidence of DVT seen on physical exam. Labs: Lab  Results  Component Value Date   WBC 15.5 (H) 07/19/2018   HGB 9.2 (L) 07/19/2018   HCT 29.6 (L) 07/19/2018   MCV 89.7 07/19/2018   PLT 234 07/19/2018   CMP Latest Ref Rng & Units 07/19/2018  Glucose 65 - 99 mg/dL -  BUN 6 - 20 mg/dL -  Creatinine 1.470.44 - 8.291.00 mg/dL 5.620.65  Sodium 130134 - 865144 mmol/L -  Potassium 3.5 - 5.2 mmol/L -  Chloride 96 - 106 mmol/L -  CO2 20 - 29 mmol/L -  Calcium 8.7 - 10.2 mg/dL -  Total Protein 6.0 - 8.5 g/dL -  Total Bilirubin 0.0 - 1.2 mg/dL -  Alkaline Phos 39 - 784117 IU/L -  AST 0 - 40 IU/L -  ALT 0 - 32 IU/L -    Discharge instruction: per After Visit Summary and "Baby and Me Booklet".  After visit meds: IBU, percocet, iron, zoloft per patient request, and depo provera prior to discharge home   Diet: routine diet  Activity: Advance as tolerated.  Pelvic rest for 6 weeks.   Outpatient follow up:2 weeks Follow up Appt: Future Appointments  Date Time Provider Department Center  08/01/2018  1:30 PM [facility]IC[facility]  08/04/2018  2:30 PM [facility] None  08/31/2018  8:10 AM Joanne Hubbard, CNM [facility] None   Follow up Visit: Follow-up Information    Center for [facility]. Schedule an appointment as soon as possible for a visit in 2 week(s).   Specialty:  Obstetrics and Gynecology Why:  To see Joanne Hubbard and to have an incision check She will [facility] for her 4 week postpartum visit Contact information: [address], [address] [id number] mc [place] [phone]         Postpartum visit will be at 6 weeks with a 2 hour GTT (due to abnormal HBA1C and polyhydramnios seen at time of cesarean section).  Newborn Data: Live born adult  Birth Weight:   APGAR: ,   Newborn Delivery   Birth date/time:   Delivery type:  C-Section, Low Transverse Trial of labor:  No C-section categorization:  Repeat     Baby Feeding: Breast, breast pump  prescribed Disposition:NICU   07/21/2018 Allie Bossier, MD

## 2018-07-18 NOTE — Lactation Note (Signed)
This note was copied from a baby's chart. Lactation Consultation Note  Patient Name: Joanne Hubbard Date: 07/18/2018   Attempted to visit with mom but she's asleep, LC to return later to do lactation assessment, mom has a baby in NICU.  Maternal Data    Feeding    LATCH Score                   Interventions    Lactation Tools Discussed/Used     Consult Status      Donnita Farina Venetia Constable 07/18/2018, 5:27 PM

## 2018-07-18 NOTE — Lactation Note (Signed)
This note was copied from a baby's chart. Lactation Consultation Note  Patient Name: Joanne Hubbard Date: 07/18/2018 Reason for consult: Initial assessment;NICU baby;Late-preterm 5-36.6wks  Visited with mom of a 10 hours old LPI NICU female; mom is a P5 but not very experienced BF. She said she's BF her other kids for about a month but she's already familiar with hand expression. When reviewing hand expression with mom noticed that her nipples are inverted, but they evert upon compression, her tissue is compressible. Mom participated in the [facility] program during the pregnancy in the [facility].  LC set mom up with a DEBP, instructions, cleaning and storage were reviewed as well as milk storage guidelines for NICU infants. LC also asked RN Tresa Endo for coconut oil, mom voiced she doesn't feel comfortable pumping both breasts at the same time. Explained to mom the importance of bilateral pumping but mom won't do it, she has never done it like that before, she's always pumped one breast at a time. Mom started pumping on her right breast, she understands that pumping early on is mainly for breast stimulation and not to get volume. Asked mom to call for assistance when needed.  Feeding plan  1. Encouraged mom to pump every 2-3 hours and at least once at night 2. Mom will apply coconut oil prior pumping  3. Once she starts getting drops, she'll take those to her baby in NICU with labels provided for breastmilk storage  BF brochure, BF resources and feeding diary were reviewed. Mom reported all questions and concerns were answered, she's aware of LC services and will call PRN.  Maternal Data Formula Feeding for Exclusion: No Has patient been taught Hand Expression?: Yes Does the patient have breastfeeding experience prior to this delivery?: Yes  Feeding    Interventions Interventions: Breast feeding basics reviewed;Hand express;Breast compression;Breast massage;DEBP;Coconut oil  Lactation Tools  Discussed/Used Tools: Pump;Coconut oil Breast pump type: Double-Electric Breast Pump WIC Program: Yes Pump Review: Setup, frequency, and cleaning;Milk Storage Initiated by:: MPeck Date initiated:: 07/18/18   Consult Status Consult Status: PRN Date: 07/19/18 Follow-up type: In-patient    Ahmya Bernick Venetia Constable 07/18/2018, 10:58 PM

## 2018-07-18 NOTE — Transfer of Care (Signed)
Immediate Anesthesia Transfer of Care Note  Patient: Joanne Hubbard  Procedure(s) Performed: CESAREAN SECTION with bilateral tubal ligation (N/A )  Patient Location: PACU  Anesthesia Type:Epidural  Level of Consciousness: awake, alert  and oriented  Airway & Oxygen Therapy: Patient Spontanous Breathing  Post-op Assessment: Report given to RN and Post -op Vital signs reviewed and stable  Post vital signs: Reviewed and stable  Last Vitals:  Vitals Value Taken Time  BP    Temp    Pulse 79 07/18/2018  2:19 PM  Resp 14 07/18/2018  2:19 PM  SpO2 97 % 07/18/2018  2:19 PM  Vitals shown include unvalidated device data.  Last Pain:  Vitals:   07/18/18 1415  TempSrc: Axillary  PainSc: 0-No pain      Patients Stated Pain Goal: 4 (07/18/18 1400)  Complications: No apparent anesthesia complications

## 2018-07-19 ENCOUNTER — Encounter (HOSPITAL_COMMUNITY): Payer: Self-pay | Admitting: Obstetrics & Gynecology

## 2018-07-19 LAB — CBC
HCT: 29.6 % — ABNORMAL LOW (ref 36.0–46.0)
Hemoglobin: 9.2 g/dL — ABNORMAL LOW (ref 12.0–15.0)
MCH: 27.9 pg (ref 26.0–34.0)
MCHC: 31.1 g/dL (ref 30.0–36.0)
MCV: 89.7 fL (ref 80.0–100.0)
Platelets: 234 10*3/uL (ref 150–400)
RBC: 3.3 MIL/uL — ABNORMAL LOW (ref 3.87–5.11)
RDW: 14.6 % (ref 11.5–15.5)
WBC: 15.5 10*3/uL — ABNORMAL HIGH (ref 4.0–10.5)
nRBC: 0 % (ref 0.0–0.2)

## 2018-07-19 LAB — GC/CHLAMYDIA PROBE AMP (~~LOC~~) NOT AT ARMC
Chlamydia: NEGATIVE
Neisseria Gonorrhea: NEGATIVE

## 2018-07-19 LAB — CREATININE, SERUM
Creatinine, Ser: 0.65 mg/dL (ref 0.44–1.00)
GFR calc Af Amer: >60 mL/min (ref >60)
GFR calc non Af Amer: >60 mL/min (ref >60)

## 2018-07-19 MED ORDER — ONDANSETRON HCL 4 MG PO TABS
4.0000 mg | ORAL_TABLET | Freq: Four times a day (QID) | ORAL | Status: DC | PRN
  Administered 2018-07-19: 4 mg via ORAL
  Filled 2018-07-19: qty 1

## 2018-07-19 MED ORDER — HYDROXYZINE HCL 25 MG PO TABS: 25.0000 mg | ORAL_TABLET | Freq: Four times a day (QID) | ORAL | Status: DC | PRN

## 2018-07-19 MED ORDER — SERTRALINE HCL 50 MG PO TABS
50.0000 mg | ORAL_TABLET | Freq: Every day | ORAL | Status: DC
  Administered 2018-07-19 – 2018-07-21 (×3): 50 mg via ORAL
  Filled 2018-07-19 (×3): qty 1

## 2018-07-19 NOTE — Progress Notes (Signed)
CSW acknowledges consult and completed clinical assessment.  Clinical documentation will follow.  There are no barriers to d/c.  Faron Tudisco Boyd-Gilyard, MSW, LCSW Clinical Social Work [phone]   

## 2018-07-19 NOTE — Anesthesia Postprocedure Evaluation (Signed)
Anesthesia Post Note  Patient: Joanne Hubbard  Procedure(s) Performed: CESAREAN SECTION with bilateral tubal ligation (N/A )     Patient location during evaluation: Mother Baby Anesthesia Type: Epidural Level of consciousness: oriented and awake and alert Pain management: pain level controlled Vital Signs Assessment: post-procedure vital signs reviewed and stable Respiratory status: spontaneous breathing and respiratory function stable Cardiovascular status: blood pressure returned to baseline and stable Postop Assessment: no headache, no backache, no apparent nausea or vomiting and able to ambulate Anesthetic complications: no    Last Vitals:  Vitals:   07/18/18 2117 07/18/18 2304  BP: 102/70   Pulse: 71   Resp: 18   Temp: 36.7 C   SpO2: 98% 97%    Last Pain:  Vitals:   07/18/18 2330  TempSrc:   PainSc: Asleep   Pain Goal: Patients Stated Pain Goal: 3 (07/18/18 1640)                 Trevor Iha

## 2018-07-19 NOTE — Lactation Note (Signed)
This note was copied from a baby's chart. Lactation Consultation Note  Patient Name: Joanne Hubbard [id number] Date: 07/19/2018   Attempted to visit with Mom, but she was not in her room. Referral faxed to [facility] for pump at discharge.  LC to follow-up at a later time.  [name] 07/19/2018, 6:39 PM

## 2018-07-19 NOTE — Progress Notes (Addendum)
Subjective: Postpartum Day 1: Cesarean Delivery Patient reports incisional pain, tolerating PO and no problems voiding.   Requesting Rx for anxiety Also wants to get started on something for her anxiety and depression.  Has taken Zoloft before with success  Objective: Vital signs in last 24 hours: Temp:  [97.5 F (36.4 C)-98.4 F (36.9 C)] 98 F (36.7 C) (05/26 0830) Pulse Rate:  [71-111] 88 (05/26 0830) Resp:  [11-20] 20 (05/26 0830) BP: (94-128)/(49-72) 106/53 (05/26 0830) SpO2:  [95 %-100 %] 99 % (05/26 0830)  Physical Exam:  General: alert, cooperative and no distress Lochia: appropriate Uterine Fundus: firm Incision: healing well, no significant drainage DVT Evaluation: No evidence of DVT seen on physical exam.  Recent Labs    07/18/18 0100 07/19/18 0549  HGB 11.5* 9.2*  HCT 36.3 29.6*    Assessment/Plan: Status post Cesarean section. Doing well postoperatively.  Continue current care Consult to social work Will make sure she has followup with Marijean Niemann in office Ordered Vistaril for prn anxiety issues Will start her on Zoloft 50mg  daily for anxiety/depression (consulted Dr Adrian Blackwater).  Wynelle Bourgeois 07/19/2018, 9:52 AM

## 2018-07-20 MED ORDER — GABAPENTIN 100 MG PO CAPS
100.0000 mg | ORAL_CAPSULE | Freq: Three times a day (TID) | ORAL | Status: DC
  Administered 2018-07-21 (×2): 100 mg via ORAL
  Filled 2018-07-20 (×2): qty 1

## 2018-07-20 NOTE — Clinical Social Work Maternal (Signed)
CLINICAL SOCIAL WORK MATERNAL/CHILD NOTE  Patient Details  Name: Joanne Hubbard MRN: [id number] Date of Birth: 1991/01/22  Date:  07/20/2018  Clinical Social Worker Initiating Note:  Laurey Arrow Date/Time: Initiated:  07/19/18/1511     Child's Name:  Unknown   Biological Parents:  Mother, Father   Need for Interpreter:  None   Reason for Referral:  Adoption, Late or No Prenatal Care    Address:  [address] [place] [address]    Phone number:  [phone] (home)     Additional phone number:   Household Members/Support Persons (HM/SP):   Household Member/Support Person 1, Household Member/Support Person 2, Household Member/Support Person 3, Household Member/Support Person 4, Household Member/Support Person 5   HM/SP Name Relationship DOB or Age  HM/SP -1 Carleene Cooper  FOB  08/21/1986  HM/SP -2 Mikel McGee son  11/19/2010  HM/SP -3 Joanette Gula daughter 05/05/2012  HM/SP -4 Ricci Barker daughter 04/12/2014  HM/SP -5 Bishop Lilia Pro son 12/20/201  HM/SP -6        HM/SP -7        HM/SP -8          Natural Supports (not living in the home):  Immediate Family   Professional Supports: Therapist   Employment: Unemployed, Disabled   Type of Work:     Education:  9 to 11 years   Homebound arranged: No  Financial Resources:  [employer], Information systems manager , SSI/Disability   Other Resources:  Physicist, medical , [employer]   Cultural/Religious Considerations Which May Impact Care:  None reported  Strengths:  Understanding of illness   Psychotropic Medications:         Pediatrician:       Pediatrician List:   [place]      Pediatrician Fax Number:    Risk Factors/Current Problems:  Mental Health Concerns    Cognitive State:  Able to Concentrate , Alert , Linear Thinking , Insightful    Mood/Affect:  Comfortable , Interested , Relaxed , Tearful     CSW Assessment: CSW met with MOB in room 419 to complete an assessment for an adoption plan, limited PNC, and hx of anxiety and depression. When CSW arrived, MOB was resting in bed and FOB was watching TV.  With MOB's permission, CSW asked FOB to leave the room in order to meet with MOB in private. MOB was polite, emotional, and easy to engage.    CSW asked about MOB's thoughts and feelings regarding infant's NICU admission.  MOB became tearful and communicated feeling sad and overwhelmed.  MOB stated, "I didn't plan to have my baby this early and give him for adoption." CSW validated and normalized MOB's feelings and expressed that making an adoption plan can be hard decision; MOB agreed. CSW asked MOB on a scale of 1-10 (10 being most confident in doing an adoption) where would MOB measure;MOB stated a 6.  MOB shared "My feeling maybe different if my baby did not have to go to the NICU."  CSW was understanding and provided MOB education regarding PPD. Prior to providing PPD education CSW reviewed MOB's [place] responses (MOB scored a 14). MOB concluded that her high score on the [place] was attributed to MOB's decision to make an adoption plan and infant's NICU admission.   CSW provided education regarding the baby blues period vs.  perinatal mood disorders, discussed treatment and gave resources for mental health follow up if concerns arise.  CSW recommends self-evaluation during the postpartum time period using the New Mom Checklist from Postpartum Progress and encouraged MOB to contact a medical professional if symptoms are noted at any time. MOB presented with insight and awareness and did not display any acute MH symptoms.  CSW assessed for safety and MOB denied SI, HI, and DV. MOB was also receptive to resources for outpatient counseling and reported that MD has prescribed MOB with Zoloft while inpatient.   As CSW begin to assess for SA hx, FOB entered the room and MOB requested that FOB remain  in the room while CSW completed the assessment. CSW asked FOB about his desires to do an adoption and FOB stated, "We are not giving this baby up for adoption." MOB appeared confused as evidence by her facial expression and stated, "We not? I didn't you had made your mind up; you didn't say anything to me."  CSW expressed that completing an adoption plan is a decision that both parents will need to agree on and make.  CSW suggested that the couple engage in a private conversation and make a decision based on what's best for their family; the couple agreed.   MOB expressed frustration with hospital staff and communicated, "Why do people keep coming in here asking me what am I going to do; are you making an adoption plan?" CSW apologized for the repetitive questions by staff and explained that staff just want to make sure to support MOB and family in whatever decision that the family makes.  CSW suggested that if staff ask about MOB's adoption plan for MOB and FOB refer them to [place]; the couple agreed and thanked CSW.  CSW informed the couple of the hospital's limited [facility] policy. MOB reported having limited PNC due to the COVID-19.  CSW made the family aware of drug screens for infant and the couple was understanding.  MOB denied the use of all illicit substances.  The family is aware that CSW will monitor infant's UDS and CDS and will make a report to [place] if warranted.  MOB reported hx with CPS involvement and shared her last open case has been over a year ago in [place].  MOB was unable to explain reason for the open case/report.   CSW assessed for barriers to visiting with infant and other psychosocial stressors.  MOB denied all other stressors and reported having reliable transportation to visit with infant as often as they like. MOB and FOB both communicated that the family may need assistance with obtaining a car seat and safe sleeping are for infant. CSW agreed to assist the family if  needed.   CSW will continue to provide resources an supports to family while infant remains in the NICU.   CSW contact [employer] and provided adoption representative Clarise Cruz) with an update.     CSW Plan/Description:  Psychosocial Support and Ongoing Assessment of Needs, Perinatal Mood and Anxiety Disorder (PMADs) Education, Other Patient/Family Education, [place], Other Information/Referral to [employer], CSW Will Continue to Monitor Umbilical Cord Tissue Drug Screen Results and Make Report if Warranted   Laurey Arrow, MSW, LCSW Clinical Social Work [phone]   Dimple Nanas, LCSW 07/20/2018, 9:31 AM

## 2018-07-20 NOTE — Progress Notes (Signed)
Plan of care told for today. Patient has no complaints at this time. Patient is appropriate with  Care.

## 2018-07-20 NOTE — Progress Notes (Signed)
Subjective: Postpartum Day 2: Cesarean Delivery Patient reports incisional pain, tolerating PO, + flatus and no problems voiding.                            Reports pain not well-controlled w/ibuprofen and one Percocet  Objective: Vital signs in last 24 hours: Temp:  [98 F (36.7 C)-98.8 F (37.1 C)] 98.5 F (36.9 C) (05/27 0600) Pulse Rate:  [70-98] 98 (05/27 0600) Resp:  [18-20] 20 (05/27 0600) BP: (102-115)/(53-68) 102/68 (05/27 0600) SpO2:  [95 %-99 %] 98 % (05/27 0600)  Physical Exam:  General: alert, cooperative and no distress Lochia: appropriate Uterine Fundus: firm Incision: healing well, no significant drainage DVT Evaluation: No evidence of DVT seen on physical exam.  Recent Labs    07/18/18 0100 07/19/18 0549  HGB 11.5* 9.2*  HCT 36.3 29.6*    Assessment/Plan: Status post Cesarean section. Doing well postoperatively.  Continue current care. Plan discharge home tomorrow Baby likely to stay in NICU until next week  Wynelle Bourgeois 07/20/2018, 7:58 AM

## 2018-07-21 ENCOUNTER — Ambulatory Visit: Payer: Self-pay

## 2018-07-21 ENCOUNTER — Encounter (HOSPITAL_COMMUNITY): Payer: Self-pay | Admitting: Obstetrics & Gynecology

## 2018-07-21 MED ORDER — FERROUS SULFATE 325 (65 FE) MG PO TABS: 325.0000 mg | ORAL_TABLET | Freq: Two times a day (BID) | ORAL | 0 refills | Status: DC

## 2018-07-21 MED ORDER — IBUPROFEN 800 MG PO TABS: 800.0000 mg | ORAL_TABLET | Freq: Four times a day (QID) | ORAL | 0 refills | Status: DC

## 2018-07-21 MED ORDER — OXYCODONE-ACETAMINOPHEN 5-325 MG PO TABS: 1.0000 | ORAL_TABLET | ORAL | 0 refills | Status: DC | PRN

## 2018-07-21 MED ORDER — SERTRALINE HCL 50 MG PO TABS: 50.0000 mg | ORAL_TABLET | Freq: Every day | ORAL | 6 refills | Status: DC

## 2018-07-21 MED ORDER — MEDROXYPROGESTERONE ACETATE 150 MG/ML IM SUSP: 150.0000 mg | Freq: Once | INTRAMUSCULAR | Status: DC

## 2018-07-21 NOTE — Discharge Instructions (Signed)

## 2018-07-21 NOTE — Lactation Note (Signed)
This note was copied from a baby's chart. Lactation Consultation Note:  Mother concerned that she was unable to get a pump from [facility]. I told her that I would fax over a referral now. She gave me all pertenant information and a [facility] referral was sent to [facility]. Mother reports that she is active with WIC. Infant is 34 weeks and is in the NICU.  Informed mother that she would get a call from the [facility] office for an appt to get the pump. Mother plans to use the pump in the NICU room  Until pump is secured. Mother reports that she is beginning to pump every 2-3 hours and that her breast are filling.   Advised mother to continue to follow up with [facility] services as needed while in the NICU. She is aware she call have LC paged or leave a message for follow.   Patient Name: Joanne Hubbard [id number] Date: 07/21/2018 Reason for consult: Follow-up assessment   Maternal Data    Feeding Feeding Type: Formula Nipple Type: Nfant Slow Flow (purple)  LATCH Score                   Interventions    Lactation Tools Discussed/Used     Consult Status Consult Status: Complete    Michel Bickers 07/21/2018, 12:45 PM

## 2018-07-21 NOTE — Care Management Important Message (Signed)
Important Message  Patient Details  Name: Joanne Hubbard MRN: [id number] Date of Birth: March 28, 1990   Medicare Important Message Given:  Yes    Renie Ora 07/21/2018, 10:33 AM

## 2018-07-21 NOTE — Progress Notes (Addendum)
CSW met with MOB and FOB at the request of the parents.  When CSW arrived, MOB was in the shower with the door open and FOB was resting on the couch.  CSW informed the family that [place] will return at a later time and MOB was adamant about meeting with CSW at this time. From the bathroom, MOB asked questions regarding obtaining essential items for infant due to MOB and FOB deciding not to move forward with an adoption plan.  CSW informed the family of resources available and the assistance that CSW can provide.   CSW will continue to provide resources and supports while infant remains in NICU.  Laurey Arrow, MSW, LCSW Clinical Social Work [phone]

## 2018-07-22 ENCOUNTER — Ambulatory Visit: Payer: Self-pay

## 2018-07-22 LAB — TYPE AND SCREEN
ABO/RH(D): A POS
Antibody Screen: NEGATIVE
Unit division: 0
Unit division: 0

## 2018-07-22 LAB — BPAM RBC
Blood Product Expiration Date: 202006102359
Blood Product Expiration Date: 202006112359
Unit Type and Rh: 6200
Unit Type and Rh: 6200

## 2018-07-22 LAB — HEMOGLOBIN A1C
Hgb A1c MFr Bld: 6.5 % — ABNORMAL HIGH (ref 4.8–5.6)
Mean Plasma Glucose: 140 mg/dL

## 2018-07-22 NOTE — Lactation Note (Signed)
This note was copied from a baby's chart. Lactation Consultation Note  Patient Name: Joanne Hubbard [id number] Date: 07/22/2018 Reason for consult: Follow-up assessment;Mother's request;NICU baby;Early term 37-38.6wks;Late-preterm 34-36.6wks;Difficult latch  1400 - 1440 - I visited Ms. Wehunt upon her request to assist with latching her son, Joanne Hubbard. Joanne Hubbard is showing readiness cues for breast feeding.  We placed him in cross cradle hold. I showed mom how to position baby at the breast and how to make a "U" hold to compress breast for latch. I explained the rationale for this. I showed mom how to stimulate baby to root by brushing her nipple across baby's filtrum. Baby latched with a few practic suckles and then held nipple in his mouth. I discussed the importance of lick and learn and provided some reassurance to mom. She seemed please that baby latched briefly.  We attempted again, and he latched once more with some good rhythmic suckling sequences until he again stopped and held nipple in his mouth.  After a few attempts, baby began to act frustrated. I placed him skin to skin on mom's chest and prepared a 60 ml bottle of expressed breast milk.  RN helped mom position baby for bottle feed while I wrote down some notes for mom.  Mom and I discussed her feeding and pumping plan. She is currently pumping about 3-4 times a day using a manual pump primarily. She pumps each breast about 20 minutes each, and is obtaining about 2 ounces combined with each pump. Mom is therefore obtaining about 6-8 ounces of milk a day.  Mom states that her milk began to come in on 5/27.  Plan: - Mom to pump 8 times a day, and I stressed the improtance of nighttime pumping. - I encouraged mom to use her DEBP and we discussed pumping frequency. - Mom states that her nipples are getting sore from pumping. I suggested we chagne her flange size to 27 and to adjust pump pressure. I also suggested that she double  pump for 15 minutes vs. Single pump for 40 minutes. - I advised mom attempt to feed baby with cues at a minimum of every three hours and to limit feedings to about 30 minutes at this time or to stop if baby becomes frustrated. She is to pump and bottle feed following.  No further questions at this time. Mom will call PRN for more assistance.  Maternal Data Formula Feeding for Exclusion: No Has patient been taught Hand Expression?: Yes Does the patient have breastfeeding experience prior to this delivery?: Yes  Feeding Feeding Type: Breast Milk with Formula added Nipple Type: Nfant Slow Flow (purple)  LATCH Score Latch: Repeated attempts needed to sustain latch, nipple held in mouth throughout feeding, stimulation needed to elicit sucking reflex.  Audible Swallowing: None  Type of Nipple: Everted at rest and after stimulation  Comfort (Breast/Nipple): Soft / non-tender  Hold (Positioning): Assistance needed to correctly position infant at breast and maintain latch.  LATCH Score: 6  Interventions Interventions: Breast feeding basics reviewed;Assisted with latch;Skin to skin;Breast massage;Hand express;Adjust position;Support pillows  Lactation Tools Discussed/Used WIC Program: Yes Pump Review: Setup, frequency, and cleaning   Consult Status Consult Status: Follow-up Follow-up type: Call as needed    Walker Shadow 07/22/2018, 2:49 PM

## 2018-07-23 ENCOUNTER — Ambulatory Visit: Payer: Self-pay

## 2018-07-23 NOTE — Lactation Note (Addendum)
This note was copied from a baby's chart. Lactation Consultation Note  Patient Name: Joanne Hubbard Date: 07/23/2018 Reason for consult: Mother's request;Difficult latch;Late-preterm 34-36.6wks P5, 5 day old female infant, LPTI, weight loss -6%. Mom requesting latch assistance. LC changed large stool while in room (brownish yellow and seedy) in color. Mom attempt to latch infant in cross cradle hold at first, infant would not sustain latch. LC notice mom has large pseudo inverted nipples.  LC fitted mom with 24 mm NS and pre-filled NS with 1 ml of breast milk. Mom latched infant on left breast, using the football hold, infant sustain latch, swallows heard with "Cuh" sound, infant breastfeed for 14 minutes and breastmilk was present in NS when infant came off breast. Mom was very pleased with feeding and was doing STS. Nurse discussed with mom infant will receive 1/3 of a feeding of EBM supplement when LC left room. Per mom, she has been pumping as advised by [facility] yesterday to help maintain her milk supply 8 times within 24 hours. Mom will continue to work towards latching infant to breast, mom will call Nurse or LC if she has any questions, concerns or need assistance with latching infant to breast. Mom's plan: 1. Latch infant to breast while visiting infant in hospital. 2. Mom will pre-fill 24 mm NS with EBM using curve tip syringe prior to latching . 3. Mom will also attempt latching infant to breast  without NS. 4. Mom will continue to use DEBP every 3 hours as advised. 5. Will do as much STS as possible with infant.   Maternal Data    Feeding    LATCH Score Latch: Grasps breast easily, tongue down, lips flanged, rhythmical sucking.  Audible Swallowing: Spontaneous and intermittent  Type of Nipple: Inverted  Comfort (Breast/Nipple): Soft / non-tender  Hold (Positioning): Assistance needed to correctly position infant at breast and maintain latch.  LATCH Score:  7  Interventions Interventions: Assisted with latch;Skin to skin;Breast compression  Lactation Tools Discussed/Used Tools: Nipple Shields;43F feeding tube / Syringe   Consult Status Consult Status: Follow-up Follow-up type: Call as needed    Joanne Hubbard 07/23/2018, 9:14 PM

## 2018-07-26 ENCOUNTER — Encounter: Payer: Self-pay | Admitting: Obstetrics & Gynecology

## 2018-07-26 DIAGNOSIS — O24919 Unspecified diabetes mellitus in pregnancy, unspecified trimester: Secondary | ICD-10-CM | POA: Insufficient documentation

## 2018-07-26 LAB — HIV ANTIBODY (ROUTINE TESTING W REFLEX): HIV Screen 4th Generation wRfx: NONREACTIVE

## 2018-07-26 LAB — RPR: RPR Ser Ql: NONREACTIVE

## 2018-08-01 ENCOUNTER — Other Ambulatory Visit: Payer: Self-pay

## 2018-08-01 ENCOUNTER — Ambulatory Visit: Payer: Self-pay | Admitting: Clinical

## 2018-08-01 NOTE — BH Specialist Note (Signed)
Pt did not arrive to [facility] video visit. When patient is called, twice to confirm the number, message of "your call cannot be completed at this time". No voicemail, and no phone message left. MyChart message sent to patient.    08/01/2018 Joanne Hubbard [id number]   Garlan Fair, LCSW

## 2018-08-04 ENCOUNTER — Telehealth: Payer: Medicare Other

## 2018-08-04 ENCOUNTER — Telehealth: Payer: Self-pay | Admitting: General Practice

## 2018-08-04 NOTE — Telephone Encounter (Signed)
Attempted to call pt to inform her to sign into Mychart to start visit with RN.  Pt did not pick up and was unable to leave a voice mail.

## 2018-08-07 ENCOUNTER — Inpatient Hospital Stay (HOSPITAL_COMMUNITY): Admit: 2018-08-07 | Payer: Medicare Other | Admitting: Obstetrics & Gynecology

## 2018-08-31 ENCOUNTER — Ambulatory Visit: Payer: Medicare Other | Admitting: Obstetrics and Gynecology

## 2018-09-19 ENCOUNTER — Ambulatory Visit (HOSPITAL_COMMUNITY)
Admission: EM | Admit: 2018-09-19 | Discharge: 2018-09-19 | Disposition: A | Discharge status: Home / Self Care | Payer: Medicare Other | Attending: Emergency Medicine | Admitting: Emergency Medicine

## 2018-09-19 ENCOUNTER — Encounter (HOSPITAL_COMMUNITY): Payer: Self-pay | Admitting: Emergency Medicine

## 2018-09-19 ENCOUNTER — Other Ambulatory Visit: Payer: Self-pay

## 2018-09-19 DIAGNOSIS — Z5189 Encounter for other specified aftercare: Secondary | ICD-10-CM | POA: Diagnosis not present

## 2018-09-19 DIAGNOSIS — M79672 Pain in left foot: Secondary | ICD-10-CM

## 2018-09-19 NOTE — Discharge Instructions (Addendum)
Take Tylenol or Motrin as needed.  Wear the Ace wrap as needed for comfort.    Follow-up with your OB/GYN for recheck of your incision.

## 2018-09-19 NOTE — ED Triage Notes (Signed)
Pt sts left foot pain x several weeks; pt denies obvious injury; pt sts needs wound check to csection incision as she sts some bloody drainage this morning; pt sts procedure was 07/18/18

## 2018-09-19 NOTE — ED Provider Notes (Signed)
[facility]    CSN: [id number] Arrival date & time: 09/19/18  1233     History   Chief Complaint Chief Complaint  Patient presents with  . Foot Pain  . Wound Check    HPI Joanne Hubbard is a 28 y.o. female.   Patient presents with left foot pain for several weeks which is worse over the past 4 to 5 days.  She denies falls or injury.  She denies wounds, rash, fever, chills, other symptoms.  She also would like the incision from her C-section done on 07/18/2018 check today; she states she noted some bloody drainage this morning; she states the incision has intermittently drained since May.    The history is provided by the patient.    Past Medical History:  Diagnosis Date  . Anxiety   . Asthma    Resoved as child  . Chlamydia   . Depression     Patient Active Problem List   Diagnosis Date Noted  . Diabetes in pregnancy 07/26/2018  . Preterm labor 07/18/2018  . Delivery of infant large for gestational age 22/25/2020  . Migraine without aura and without status migrainosus, not intractable 05/06/2018  . Muscle spasm 05/06/2018  . History of preterm delivery 04/15/2018  . S/P cesarean section 04/15/2018  . Herpes simplex type 2 (HSV-2) infection affecting pregnancy, antepartum 04/15/2018  . Uterine rupture during labor 04/15/2018  . Supervision of high risk pregnancy, antepartum 03/15/2018    Past Surgical History:  Procedure Laterality Date  . CESAREAN SECTION    . CESAREAN SECTION WITH BILATERAL TUBAL LIGATION N/A 07/18/2018   Procedure: CESAREAN SECTION with bilateral tubal ligation;  Surgeon: Tereso NewcomerAnyanwu, Ugonna A, MD;  Location: [facility];  Service: Obstetrics;  Laterality: N/A;  . CHOLECYSTECTOMY    . uterine rupture      OB History    Gravida  7   Para  5   Term  3   Preterm  2   AB  2   Living  5     SAB  1   TAB  1   Ectopic      Multiple  0   Live Births  5            Home Medications    Prior to Admission medications    Medication Sig Start Date End Date Taking? Authorizing Provider  ferrous sulfate 325 (65 FE) MG tablet Take 1 tablet (325 mg total) by mouth 2 (two) times daily with a meal. 07/21/18   Tamera StandsWallace, Laurel S, DO  ibuprofen (ADVIL) 800 MG tablet Take 1 tablet (800 mg total) by mouth every 6 (six) hours. 07/21/18   Allie Bossierove, Myra C, MD  oxyCODONE-acetaminophen (PERCOCET/ROXICET) 5-325 MG tablet Take 1 tablet by mouth every 4 (four) hours as needed for moderate pain. 07/21/18   Allie Bossierove, Myra C, MD  Prenatal Vit-Fe Fumarate-FA (PRENATAL PLUS/IRON) 27-1 MG TABS Take 1 tablet by mouth daily. 03/15/18   Raelyn Moraawson, Rolitta, CNM  sertraline (ZOLOFT) 50 MG tablet Take 1 tablet (50 mg total) by mouth daily. 07/21/18   Allie Bossierove, Myra C, MD    Family History Family History  Adopted: Yes  Family history unknown: Yes    Social History Social History   Tobacco Use  . Smoking status: Current Every Day Smoker    Packs/day: 0.25    Years: 7.00    Pack years: 1.75    Types: Cigarettes  . Smokeless tobacco: Never Used  Substance Use Topics  .  Alcohol use: Not Currently    Comment: drank on 12-25-17  . Drug use: Not Currently     Allergies   Clonopin [clonazepam]   Review of Systems Review of Systems  Constitutional: Negative for chills and fever.  HENT: Negative for ear pain and sore throat.   Eyes: Negative for pain and visual disturbance.  Respiratory: Negative for cough.   Cardiovascular: Negative for palpitations.  Gastrointestinal: Negative for vomiting.  Genitourinary: Negative for dysuria and hematuria.  Musculoskeletal: Positive for arthralgias. Negative for back pain.  Skin: Positive for wound. Negative for color change and rash.  Neurological: Negative for seizures and syncope.  All other systems reviewed and are negative.    Physical Exam Triage Vital Signs ED Triage Vitals [09/19/18 1333]  Enc Vitals Group     BP (!) 126/94     Pulse Rate 73     Resp 18     Temp (!) 97.3 F (36.3 C)      Temp Source Oral     SpO2 99 %     Weight      Height      Head Circumference      Peak Flow      Pain Score 7     Pain Loc      Pain Edu?      Excl. in [place]?    No data found.  Updated Vital Signs BP (!) 126/94 (BP Location: Right Arm)   Pulse 73   Temp (!) 97.3 F (36.3 C) (Oral)   Resp 18   SpO2 99%   Visual Acuity Right Eye Distance:   Left Eye Distance:   Bilateral Distance:    Right Eye Near:   Left Eye Near:    Bilateral Near:     Physical Exam Vitals signs and nursing note reviewed.  Constitutional:      General: She is not in acute distress.    Appearance: She is well-developed.  HENT:     Head: Normocephalic and atraumatic.  Eyes:     Conjunctiva/sclera: Conjunctivae normal.  Neck:     Musculoskeletal: Neck supple.  Cardiovascular:     Rate and Rhythm: Normal rate and regular rhythm.     Heart sounds: No murmur.  Pulmonary:     Effort: Pulmonary effort is normal. No respiratory distress.     Breath sounds: Normal breath sounds.  Abdominal:     Palpations: Abdomen is soft.     Tenderness: There is no abdominal tenderness.  Musculoskeletal:        General: Tenderness present. No swelling, deformity or signs of injury.     Comments: Left foot: Tender to palpation over the dorsal lateral foot.  ROM limited by pain, worse with extension. Sensation intact.  No ecchymosis, erythema, wounds.  2+ pulses.   Skin:    General: Skin is warm and dry.     Comments: Surgical incision and lower abdomen: Pinpoint opening on left side with scant bloody drainage when palpated.  No erythema or streaks.   Neurological:     Mental Status: She is alert.     Sensory: No sensory deficit.      UC Treatments / Results  Labs (all labs ordered are listed, but only abnormal results are displayed) Labs Reviewed - No data to display  EKG   Radiology No results found.  Procedures Procedures (including critical care time)  Medications Ordered in UC Medications - No  data to display  Initial Impression / Assessment  and Plan / UC Course  I have reviewed the triage vital signs and the nursing notes.  Pertinent labs & imaging results that were available during my care of the patient were reviewed by me and considered in my medical decision making (see chart for details).   Left foot pain.  Wound check.  Patient declined x-ray.  Ace wrap applied and instructed patient to wear it as needed for comfort.  Instructed patient to take Tylenol or Motrin as needed.  Pinpoint opening in surgical incision from C-section done in May; instructed patient to follow-up with her OB/GYN; wound care instructions given.     Final Clinical Impressions(s) / UC Diagnoses   Final diagnoses:  Foot pain, left  Visit for wound check     Discharge Instructions     Take Tylenol or Motrin as needed.  Wear the Ace wrap as needed for comfort.    Follow-up with your OB/GYN for recheck of your incision.      ED Prescriptions    None     Controlled Substance Prescriptions [place] Controlled Substance Registry consulted? Not Applicable   Mickie Bailate, Micky Overturf H, NP 09/19/18 1443

## 2018-09-21 ENCOUNTER — Encounter: Payer: Self-pay | Admitting: General Practice

## 2018-09-22 ENCOUNTER — Inpatient Hospital Stay (HOSPITAL_COMMUNITY)
Admission: AD | Admit: 2018-09-22 | Discharge: 2018-09-22 | Disposition: A | Discharge status: Home / Self Care | Payer: Medicare Other | Attending: Obstetrics and Gynecology | Admitting: Obstetrics and Gynecology

## 2018-09-22 ENCOUNTER — Other Ambulatory Visit: Payer: Self-pay

## 2018-09-22 ENCOUNTER — Telehealth: Payer: Self-pay | Admitting: General Practice

## 2018-09-22 DIAGNOSIS — O9089 Other complications of the puerperium, not elsewhere classified: Secondary | ICD-10-CM | POA: Diagnosis not present

## 2018-09-22 DIAGNOSIS — Z4889 Encounter for other specified surgical aftercare: Secondary | ICD-10-CM

## 2018-09-22 NOTE — MAU Note (Addendum)
Post Partum 5/25 c-section. Stated she has had small amount drainage for 3 days. Has a small "pinhole" opening on the left side of her incision. Stated the incision has always felt moist and had an odor. Not dure if it is just because she has a "fold" over it. Has some pain near the incision with a" taring " sensation . Did not go to her postpartum appointment.

## 2018-09-22 NOTE — Telephone Encounter (Signed)
Patient returning call that she received from our clinic RN on 09/20/2018 in regards to her incision. Patient had spoken to [facility] (after hours nurse) that her incision was open and had an odor.  Our clinic RN advised patient that she will need to return to [facility]U for evaluation.  Pt verbalized understanding.

## 2018-09-22 NOTE — MAU Provider Note (Signed)
First Provider Initiated Contact with Patient 09/22/18 1247      S Ms. Joanne Hubbard is a 28 y.o. [phone] non-pregnant female who presents to MAU today for evaluation of her LTCS incision. She is s/p LTCS and BTL 07/18/18.  She reports new onset drainage and leaking of fluid from the left side of her incision yesterday. She sought evaluation at Urgent Care and was instructed to follow up with her surgeon.  She denies fever, abdominal tenderness, dysuria. She denies seeing drainage today.  O BP (!) 118/93   Pulse 100   Temp 98.3 F (36.8 C)   Resp 18   Ht 5\' 3"  (1.6 m)   Wt 101.2 kg   LMP 09/01/2018   BMI 39.50 kg/m  Physical Exam  Nursing note and vitals reviewed. Constitutional: She is oriented to person, place, and time. She appears well-developed and well-nourished.  Cardiovascular: Normal rate.  Respiratory: Effort normal.  GI: Soft. She exhibits no distension. There is no abdominal tenderness. There is no rebound and no guarding.  Neurological: She is alert and oriented to person, place, and time.  Psychiatric: She has a normal mood and affect. Her behavior is normal. Judgment and thought content normal.    A Non pregnant female Medical screening exam complete Incision is well-healed, intact, no drainage, erythema, streaking  P Discharge from MAU in stable condition Patient given the option of transfer to [facility] for further evaluation or seek care in outpatient facility of choice List of options for follow-up given  Warning signs for worsening condition that would warrant emergency follow-up discussed Patient may return to MAU as needed for pregnancy related complaints  Mallie Snooks, MSN, CNM Certified Nurse Midwife, [facility] 09/22/18 1:59 PM

## 2018-09-22 NOTE — Discharge Instructions (Signed)
Incision Care, Adult °An incision is a surgical cut that is made through your skin. Most incisions are closed after surgery. Your incision may be closed with stitches (sutures), staples, skin glue, or adhesive strips. You may need to return to your health care provider to have sutures or staples removed. This may occur several days to several weeks after your surgery. The incision needs to be cared for properly to prevent infection. °How to care for your incision °Incision care ° °· Follow instructions from your health care provider about how to take care of your incision. Make sure you: °? Wash your hands with soap and water before you change the bandage (dressing). If soap and water are not available, use hand sanitizer. °? Change your dressing as told by your health care provider. °? Leave sutures, skin glue, or adhesive strips in place. These skin closures may need to stay in place for 2 weeks or longer. If adhesive strip edges start to loosen and curl up, you may trim the loose edges. Do not remove adhesive strips completely unless your health care provider tells you to do that. °· Check your incision area every day for signs of infection. Check for: °? More redness, swelling, or pain. °? More fluid or blood. °? Warmth. °? Pus or a bad smell. °· Ask your health care provider how to clean the incision. This may include: °? Using mild soap and water. °? Using a clean towel to pat the incision dry after cleaning it. °? Applying a cream or ointment. Do this only as told by your health care provider. °? Covering the incision with a clean dressing. °· Ask your health care provider when you can leave the incision uncovered. °· Do not take baths, swim, or use a hot tub until your health care provider approves. Ask your health care provider if you can take showers. You may only be allowed to take sponge baths for bathing. °Medicines °· If you were prescribed an antibiotic medicine, cream, or ointment, take or apply the  antibiotic as told by your health care provider. Do not stop taking or applying the antibiotic even if your condition improves. °· Take over-the-counter and prescription medicines only as told by your health care provider. °General instructions °· Limit movement around your incision to improve healing. °? Avoid straining, lifting, or exercise for the first month, or for as long as told by your health care provider. °? Follow instructions from your health care provider about returning to your normal activities. °? Ask your health care provider what activities are safe. °· Protect your incision from the sun when you are outside for the first 6 months, or for as long as told by your health care provider. Apply sunscreen around the scar or cover it up. °· Keep all follow-up visits as told by your health care provider. This is important. °Contact a health care provider if: °· Your have more redness, swelling, or pain around the incision. °· You have more fluid or blood coming from the incision. °· Your incision feels warm to the touch. °· You have pus or a bad smell coming from the incision. °· You have a fever or shaking chills. °· You are nauseous or you vomit. °· You are dizzy. °· Your sutures or staples come undone. °Get help right away if: °· You have a red streak coming from your incision. °· Your incision bleeds through the dressing and the bleeding does not stop with gentle pressure. °· The edges of   your incision open up and separate. °· You have severe pain. °· You have a rash. °· You are confused. °· You faint. °· You have trouble breathing and a fast heartbeat. °This information is not intended to replace advice given to you by your health care provider. Make sure you discuss any questions you have with your health care provider. °Document Released: 08/29/2004 Document Revised: 02/11/2018 Document Reviewed: 08/28/2015 °Elsevier Patient Education © 2020 [employer] ° °

## 2019-01-26 ENCOUNTER — Telehealth (INDEPENDENT_AMBULATORY_CARE_PROVIDER_SITE_OTHER): Payer: Medicare Other | Admitting: Primary Care

## 2019-02-17 ENCOUNTER — Encounter (HOSPITAL_COMMUNITY): Payer: Self-pay | Admitting: Emergency Medicine

## 2019-02-17 ENCOUNTER — Other Ambulatory Visit: Payer: Self-pay

## 2019-02-17 ENCOUNTER — Emergency Department (HOSPITAL_COMMUNITY)
Admission: EM | Admit: 2019-02-17 | Discharge: 2019-02-17 | Disposition: A | Discharge status: Home / Self Care | Payer: Medicare Other | Attending: Emergency Medicine | Admitting: Emergency Medicine

## 2019-02-17 DIAGNOSIS — Z79899 Other long term (current) drug therapy: Secondary | ICD-10-CM | POA: Insufficient documentation

## 2019-02-17 DIAGNOSIS — R112 Nausea with vomiting, unspecified: Secondary | ICD-10-CM | POA: Diagnosis present

## 2019-02-17 DIAGNOSIS — K2900 Acute gastritis without bleeding: Secondary | ICD-10-CM | POA: Diagnosis not present

## 2019-02-17 DIAGNOSIS — F17210 Nicotine dependence, cigarettes, uncomplicated: Secondary | ICD-10-CM | POA: Diagnosis not present

## 2019-02-17 LAB — CBC
HCT: 38.8 % (ref 36.0–46.0)
Hemoglobin: 12.6 g/dL (ref 12.0–15.0)
MCH: 28.8 pg (ref 26.0–34.0)
MCHC: 32.5 g/dL (ref 30.0–36.0)
MCV: 88.8 fL (ref 80.0–100.0)
Platelets: 364 10*3/uL (ref 150–400)
RBC: 4.37 MIL/uL (ref 3.87–5.11)
RDW: 13.2 % (ref 11.5–15.5)
WBC: 12.3 10*3/uL — ABNORMAL HIGH (ref 4.0–10.5)
nRBC: 0 % (ref 0.0–0.2)

## 2019-02-17 LAB — COMPREHENSIVE METABOLIC PANEL
ALT: 15 U/L (ref 0–44)
AST: 20 U/L (ref 15–41)
Albumin: 4 g/dL (ref 3.5–5.0)
Alkaline Phosphatase: 68 U/L (ref 38–126)
Anion gap: 10 (ref 5–15)
BUN: 6 mg/dL (ref 6–20)
CO2: 23 mmol/L (ref 22–32)
Calcium: 9.3 mg/dL (ref 8.9–10.3)
Chloride: 107 mmol/L (ref 98–111)
Creatinine, Ser: 0.81 mg/dL (ref 0.44–1.00)
GFR calc Af Amer: >60 mL/min (ref >60)
GFR calc non Af Amer: >60 mL/min (ref >60)
Glucose, Bld: 111 mg/dL — ABNORMAL HIGH (ref 70–99)
Potassium: 3.9 mmol/L (ref 3.5–5.1)
Sodium: 140 mmol/L (ref 135–145)
Total Bilirubin: 0.3 mg/dL (ref 0.3–1.2)
Total Protein: 6.7 g/dL (ref 6.5–8.1)

## 2019-02-17 LAB — I-STAT BETA HCG BLOOD, ED (MC, WL, AP ONLY): I-stat hCG, quantitative: <5 m[IU]/mL (ref <5)

## 2019-02-17 LAB — LIPASE, BLOOD: Lipase: 29 U/L (ref 11–51)

## 2019-02-17 MED ORDER — ONDANSETRON 4 MG PO TBDP
4.0000 mg | ORAL_TABLET | Freq: Once | ORAL | Status: AC
  Administered 2019-02-17: 4 mg via ORAL
  Filled 2019-02-17: qty 1

## 2019-02-17 MED ORDER — SODIUM CHLORIDE 0.9% FLUSH: 3.0000 mL | Freq: Once | INTRAVENOUS | Status: DC

## 2019-02-17 MED ORDER — ONDANSETRON 4 MG PO TBDP: ORAL_TABLET | ORAL | 0 refills | Status: DC

## 2019-02-17 NOTE — ED Provider Notes (Signed)
[facility] Provider Note   CSN: [id number] Arrival date & time: 02/17/19  0241     History Chief Complaint  Patient presents with  . Abdominal Pain    Joanne Hubbard is a 28 y.o. female with a hx of anxiety, asthma presents to the Emergency Department complaining of intermittent nausea vomiting onset yesterday afternoon.  Mother reports her kids have been sick with similar.  She reports some abdominal cramping prior to vomiting but no focal abdominal pain.  She reports she has been unable to keep down any food or fluids.  She reports emesis is nonbloody nonbilious.  She denies diarrhea.  She denies fever, chills, headache, neck pain, neck stiffness, chest pain, shortness of breath, cough, URI symptoms, diarrhea, weakness, dizziness, syncope, dysuria, hematuria.  She reports she has come here to the emergency department for work note.  No known Covid contacts.  Surgical history includes cholecystectomy and cesarean section.   The history is provided by the patient and medical records. No language interpreter was used.       Past Medical History:  Diagnosis Date  . Anxiety   . Asthma    Resoved as child  . Chlamydia   . Depression     Patient Active Problem List   Diagnosis Date Noted  . Diabetes in pregnancy 07/26/2018  . Preterm labor 07/18/2018  . Delivery of infant large for gestational age 42/25/2020  . Migraine without aura and without status migrainosus, not intractable 05/06/2018  . Muscle spasm 05/06/2018  . History of preterm delivery 04/15/2018  . S/P cesarean section 04/15/2018  . Herpes simplex type 2 (HSV-2) infection affecting pregnancy, antepartum 04/15/2018  . Uterine rupture during labor 04/15/2018  . Supervision of high risk pregnancy, antepartum 03/15/2018    Past Surgical History:  Procedure Laterality Date  . CESAREAN SECTION    . CESAREAN SECTION WITH BILATERAL TUBAL LIGATION N/A 07/18/2018   Procedure:  CESAREAN SECTION with bilateral tubal ligation;  Surgeon: Tereso NewcomerAnyanwu, Ugonna A, MD;  Location: [facility];  Service: Obstetrics;  Laterality: N/A;  . CHOLECYSTECTOMY    . uterine rupture       OB History    Gravida  7   Para  5   Term  3   Preterm  2   AB  2   Living  5     SAB  1   TAB  1   Ectopic      Multiple  0   Live Births  5           Family History  Adopted: Yes  Family history unknown: Yes    Social History   Tobacco Use  . Smoking status: Current Every Day Smoker    Packs/day: 0.25    Years: 7.00    Pack years: 1.75    Types: Cigarettes  . Smokeless tobacco: Never Used  Substance Use Topics  . Alcohol use: Not Currently    Comment: drank on 12-25-17  . Drug use: Not Currently    Home Medications Prior to Admission medications   Medication Sig Start Date End Date Taking? Authorizing Provider  acetaminophen (TYLENOL) 325 MG tablet Take 650 mg by mouth every 6 (six) hours as needed for mild pain or fever.   Yes [provider]  ferrous sulfate 325 (65 FE) MG tablet Take 1 tablet (325 mg total) by mouth 2 (two) times daily with a meal. 07/21/18  Yes Tamera StandsWallace, Laurel S, DO  ibuprofen (ADVIL) 800 MG tablet Take 1 tablet (800 mg total) by mouth every 6 (six) hours. Patient not taking: Reported on 02/17/2019 07/21/18   Allie Bossier, MD  ondansetron (ZOFRAN ODT) 4 MG disintegrating tablet 4mg  ODT q4 hours prn nausea/vomit 02/17/19   Lasandra Batley, 02/19/19, PA-C  Prenatal Vit-Fe Fumarate-FA (PRENATAL PLUS/IRON) 27-1 MG TABS Take 1 tablet by mouth daily. Patient not taking: Reported on 02/17/2019 03/15/18   03/17/18, CNM  sertraline (ZOLOFT) 50 MG tablet Take 1 tablet (50 mg total) by mouth daily. Patient not taking: Reported on 02/17/2019 07/21/18   07/23/18, MD    Allergies    Clonopin [clonazepam]  Review of Systems   Review of Systems  Constitutional: Negative for appetite change, diaphoresis, fatigue, fever and unexpected weight  change.  HENT: Negative for mouth sores.   Eyes: Negative for visual disturbance.  Respiratory: Negative for cough, chest tightness, shortness of breath and wheezing.   Cardiovascular: Negative for chest pain.  Gastrointestinal: Positive for abdominal pain ( cramping, resolved ), nausea and vomiting. Negative for constipation and diarrhea.  Endocrine: Negative for polydipsia, polyphagia and polyuria.  Genitourinary: Negative for dysuria, frequency, hematuria and urgency.  Musculoskeletal: Negative for back pain and neck stiffness.  Skin: Negative for rash.  Allergic/Immunologic: Negative for immunocompromised state.  Neurological: Negative for syncope, light-headedness and headaches.  Hematological: Does not bruise/bleed easily.  Psychiatric/Behavioral: Negative for sleep disturbance. The patient is not nervous/anxious.     Physical Exam Updated Vital Signs BP 111/88 (BP Location: Right Arm)   Pulse 93   Temp 98.1 F (36.7 C) (Oral)   Resp 18   LMP 02/14/2019   SpO2 99%   Physical Exam Vitals and nursing note reviewed.  Constitutional:      General: She is not in acute distress.    Appearance: She is not diaphoretic.  HENT:     Head: Normocephalic.     Mouth/Throat:     Mouth: Mucous membranes are moist.  Eyes:     General: No scleral icterus.    Conjunctiva/sclera: Conjunctivae normal.  Cardiovascular:     Rate and Rhythm: Normal rate and regular rhythm.     Pulses: Normal pulses.          Radial pulses are 2+ on the right side and 2+ on the left side.  Pulmonary:     Effort: No tachypnea, accessory muscle usage, prolonged expiration, respiratory distress or retractions.     Breath sounds: No stridor.     Comments: Equal chest rise. No increased work of breathing. Abdominal:     General: There is no distension.     Palpations: Abdomen is soft.     Tenderness: There is no abdominal tenderness. There is no guarding or rebound.     Comments: Soft and nontender.     Musculoskeletal:     Cervical back: Normal range of motion.     Comments: Moves all extremities equally and without difficulty.  Skin:    General: Skin is warm and dry.     Capillary Refill: Capillary refill takes less than 2 seconds.  Neurological:     Mental Status: She is alert.     GCS: GCS eye subscore is 4. GCS verbal subscore is 5. GCS motor subscore is 6.     Comments: Speech is clear and goal oriented.  Psychiatric:        Mood and Affect: Mood normal.     ED Results / Procedures / Treatments  Labs (all labs ordered are listed, but only abnormal results are displayed) Labs Reviewed  COMPREHENSIVE METABOLIC PANEL - Abnormal; Notable for the following components:      Result Value   Glucose, Bld 111 (*)    All other components within normal limits  CBC - Abnormal; Notable for the following components:   WBC 12.3 (*)    All other components within normal limits  LIPASE, BLOOD  URINALYSIS, ROUTINE W REFLEX MICROSCOPIC  I-STAT BETA HCG BLOOD, ED ([facility], [facility], [facility] ONLY)   Procedures Procedures (including critical care time)  Medications Ordered in ED Medications  sodium chloride flush (NS) 0.9 % injection 3 mL (has no administration in time range)  ondansetron (ZOFRAN-ODT) disintegrating tablet 4 mg (has no administration in time range)    ED Course  I have reviewed the triage vital signs and the nursing notes.  Pertinent labs & imaging results that were available during my care of the patient were reviewed by me and considered in my medical decision making (see chart for details).    MDM Rules/Calculators/A&P                      Patient with symptoms consistent with gastritis.  Less likely COVID. Likely viral in nature.  Vitals are stable, no fever or tachycardia.  Patient is nontoxic, nonseptic appearing, in no apparent distress.  Patient does not meet the SIRS or Sepsis criteria.  Pt's symptoms have been managed in the department.  Pt declines fluid bolus.  No signs  of dehydration, tolerating PO fluids > 6 oz.  Lungs are clear.  Abd soft and nontender. No focal abdominal pain, no peritoneal signs, no concern for appendicitis, cholecystitis, pancreatitis, ruptured viscus, UTI, kidney stone, PID, ectopic pregnancy.  Supportive therapy indicated.  Patient counseled, expresses understanding and agrees with plan.   Final Clinical Impression(s) / ED Diagnoses Final diagnoses:  Acute gastritis, presence of bleeding unspecified, unspecified gastritis type  Non-intractable vomiting with nausea, unspecified vomiting type    Rx / DC Orders ED Discharge Orders         Ordered    ondansetron (ZOFRAN ODT) 4 MG disintegrating tablet     02/17/19 0607           Deantre Bourdon, Jarrett Soho, PA-C 02/17/19 5329    Ward, Delice Bison, DO 02/17/19 [phone]

## 2019-02-17 NOTE — Discharge Instructions (Signed)
1. Medications: zofran, usual home medications °2. Treatment: rest, drink plenty of fluids, advance diet slowly °3. Follow Up: Please followup with your primary doctor in 2 days for discussion of your diagnoses and further evaluation after today's visit; if you do not have a primary care doctor use the resource guide provided to find one; Please return to the ER for persistent vomiting, high fevers or worsening symptoms ° °

## 2019-02-17 NOTE — ED Notes (Signed)
Pt. Denies abdominal pain and states she only has nausea after food or drink. Pt. Is not nauseated at present. Pt had crampy pain in stomach 2 hours ago but it is gone now.

## 2019-02-17 NOTE — ED Triage Notes (Signed)
Patient reports mid abdominal pain with emesis onset yesterday , denies fever or diarrhea , patient stated her children have " stomach virus".

## 2019-02-27 ENCOUNTER — Encounter (HOSPITAL_COMMUNITY): Payer: Self-pay

## 2019-02-27 ENCOUNTER — Other Ambulatory Visit: Payer: Self-pay

## 2019-02-27 ENCOUNTER — Ambulatory Visit (HOSPITAL_COMMUNITY)
Admission: EM | Admit: 2019-02-27 | Discharge: 2019-02-27 | Disposition: A | Discharge status: Home / Self Care | Payer: Medicare Other | Attending: Family Medicine | Admitting: Family Medicine

## 2019-02-27 DIAGNOSIS — Z20822 Contact with and (suspected) exposure to covid-19: Secondary | ICD-10-CM | POA: Insufficient documentation

## 2019-02-27 DIAGNOSIS — R059 Cough, unspecified: Secondary | ICD-10-CM

## 2019-02-27 DIAGNOSIS — R05 Cough: Secondary | ICD-10-CM | POA: Diagnosis not present

## 2019-02-27 DIAGNOSIS — J029 Acute pharyngitis, unspecified: Secondary | ICD-10-CM | POA: Insufficient documentation

## 2019-02-27 NOTE — ED Triage Notes (Signed)
Patient presents to Urgent Care with complaints of sore throat and cough since being exposed to covid last week.

## 2019-02-27 NOTE — Discharge Instructions (Addendum)
If your Covid-19 test is positive, you will receive a phone call from [facility] regarding your results. Negative test results are not called. Both positive and negative results area always visible on MyChart. If you do not have a MyChart account, sign up instructions are in your discharge papers.  

## 2019-03-01 NOTE — ED Provider Notes (Signed)
[facility]   [id number] 02/27/19 Arrival Time: 1013  ASSESSMENT & PLAN:  1. Sore throat   2. Cough   3. Exposure to COVID-19 virus      COVID-19 testing sent. To self-quarantine until results are available. If requested, work note provided.  Follow-up Information    [facility].   Specialty: Urgent Care Why: As needed. Contact information: [address] [place] [address] [phone]          Reviewed expectations re: course of current medical issues. Questions answered. Outlined signs and symptoms indicating need for more acute intervention. Patient verbalized understanding. After Visit Summary given.   SUBJECTIVE: History from: patient. Nashiya Disbrow is a 29 y.o. female who requests COVID-19 testing. Known COVID-19 contact: yes. Recent travel: none. Reports mild ST and dry cough for the past several days. Denies: runny nose, congestion, fever, difficulty breathing and headache. Normal PO intake without n/v/d.  ROS: As per HPI.   OBJECTIVE:  Vitals:   02/27/19 1111  BP: 115/68  Pulse: 72  Resp: 18  Temp: 98 F (36.7 C)  TempSrc: Oral  SpO2: 100%    General appearance: alert; no distress Eyes: PERRLA; EOMI; conjunctiva normal HENT: [place]; AT; nasal mucosa normal; oral mucosa normal Neck: supple  Lungs: speaks full sentences without difficulty; unlabored Heart: regular rate and rhythm Abdomen: soft, non-tender Extremities: no edema Skin: warm and dry Neurologic: normal gait Psychological: alert and cooperative; normal mood and affect  Labs:  Labs Reviewed  NOVEL CORONAVIRUS, NAA (HOSP ORDER, SEND-OUT TO REF LAB; TAT 18-24 HRS)    Allergies  Allergen Reactions  . Clonopin [Clonazepam]     "makes me more suicidal"    Past Medical History:  Diagnosis Date  . Anxiety   . Asthma    Resoved as child  . Chlamydia   . Depression    Social History   Socioeconomic History  .  Marital status: Single    Spouse name: Not on file  . Number of children: 4  . Years of education: Not on file  . Highest education level: 11th grade  Occupational History  . Occupation: Unemployed  Tobacco Use  . Smoking status: Current Every Day Smoker    Packs/day: 0.25    Years: 7.00    Pack years: 1.75    Types: Cigarettes  . Smokeless tobacco: Never Used  Substance and Sexual Activity  . Alcohol use: Not Currently    Comment: drank on 12-25-17  . Drug use: Not Currently  . Sexual activity: Yes    Birth control/protection: None  Other Topics Concern  . Not on file  Social History Narrative   Behavior referral placed to [facility] for social issues. Patient also need a referral for social worker.   Social Determinants of Health   Financial Resource Strain: High Risk  . Difficulty of Paying Living Expenses: Hard  Food Insecurity: Food Insecurity Present  . Worried About Programme researcher, broadcasting/film/video in the Last Year: Sometimes true  . Ran Out of Food in the Last Year: Sometimes true  Transportation Needs: Unmet Transportation Needs  . Lack of Transportation (Medical): Yes  . Lack of Transportation (Non-Medical): Yes  Physical Activity: Inactive  . Days of Exercise per Week: 0 days  . Minutes of Exercise per Session: 0 min  Stress: Stress Concern Present  . Feeling of Stress : To some extent  Social Connections: Moderately Isolated  . Frequency of Communication with Friends  and Family: Twice a week  . Frequency of Social Gatherings with Friends and Family: Never  . Attends Religious Services: Never  . Active Member of Clubs or Organizations: No  . Attends Archivist Meetings: Never  . Marital Status: Living with partner  Intimate Partner Violence: Not At Risk  . Fear of Current or Ex-Partner: No  . Emotionally Abused: No  . Physically Abused: No  . Sexually Abused: No   Family History  Adopted: Yes  Family history unknown: Yes   Past Surgical History:  Procedure  Laterality Date  . CESAREAN SECTION    . CESAREAN SECTION WITH BILATERAL TUBAL LIGATION N/A 07/18/2018   Procedure: CESAREAN SECTION with bilateral tubal ligation;  Surgeon: Osborne Oman, MD;  Location: [facility];  Service: Obstetrics;  Laterality: N/A;  . CHOLECYSTECTOMY    . uterine rupture       Vanessa Kick, MD 03/01/19 [phone]

## 2019-03-02 LAB — NOVEL CORONAVIRUS, NAA (HOSP ORDER, SEND-OUT TO REF LAB; TAT 18-24 HRS): SARS-CoV-2, NAA: NOT DETECTED

## 2019-04-25 ENCOUNTER — Encounter (HOSPITAL_COMMUNITY): Payer: Self-pay

## 2019-04-25 ENCOUNTER — Ambulatory Visit (HOSPITAL_COMMUNITY)
Admission: EM | Admit: 2019-04-25 | Discharge: 2019-04-25 | Disposition: A | Discharge status: Home / Self Care | Payer: Medicare Other | Attending: Family Medicine | Admitting: Family Medicine

## 2019-04-25 ENCOUNTER — Other Ambulatory Visit: Payer: Self-pay

## 2019-04-25 DIAGNOSIS — F411 Generalized anxiety disorder: Secondary | ICD-10-CM | POA: Insufficient documentation

## 2019-04-25 DIAGNOSIS — F418 Other specified anxiety disorders: Secondary | ICD-10-CM | POA: Diagnosis not present

## 2019-04-25 DIAGNOSIS — Z760 Encounter for issue of repeat prescription: Secondary | ICD-10-CM | POA: Insufficient documentation

## 2019-04-25 DIAGNOSIS — K047 Periapical abscess without sinus: Secondary | ICD-10-CM | POA: Insufficient documentation

## 2019-04-25 DIAGNOSIS — Z20822 Contact with and (suspected) exposure to covid-19: Secondary | ICD-10-CM

## 2019-04-25 HISTORY — DX: Fetal alcohol syndrome (dysmorphic): Q86.0

## 2019-04-25 HISTORY — DX: Bipolar disorder, unspecified: F31.9

## 2019-04-25 MED ORDER — CHLORHEXIDINE GLUCONATE 0.12 % MT SOLN: 15.0000 mL | Freq: Two times a day (BID) | OROMUCOSAL | 0 refills | Status: DC

## 2019-04-25 MED ORDER — AMOXICILLIN 500 MG PO CAPS: 500.0000 mg | ORAL_CAPSULE | Freq: Two times a day (BID) | ORAL | 0 refills | Status: AC

## 2019-04-25 MED ORDER — SERTRALINE HCL 50 MG PO TABS: 50.0000 mg | ORAL_TABLET | Freq: Every day | ORAL | 6 refills | Status: DC

## 2019-04-25 MED ORDER — IBUPROFEN 800 MG PO TABS: 800.0000 mg | ORAL_TABLET | Freq: Three times a day (TID) | ORAL | 0 refills | Status: DC

## 2019-04-25 NOTE — ED Triage Notes (Signed)
Pt states she has several upper left teeth that are "broken off". Reports dental pain to area, facial swelling, swollen cervical lymph nodes, fever of 100.0 and HA for two days. Denies sore throat, cough, sob, abd pain, n/v/d.  Took 925mg  tylenol at approx 1500 today for pain.  Request COVID test and work excuse. Also request refill for zoloft.

## 2019-04-25 NOTE — Discharge Instructions (Signed)
Please use dental resource to contact offices to seek permenant treatment/relief.   Begin amoxicillin for the next week to treat underlying infection causing pain  For pain please take 600mg -800mg  of Ibuprofen every 8 hours, take with 1000 mg of Tylenol Extra strength every 8 hours. These are safe to take together. Please take with food.   Please return if you start to experience significant swelling of your face, experiencing fever.  I provided 1 month refill of your Zoloft/sertraline-follow-up with OB/GYN/primary care for further refills and management of anxiety/depression

## 2019-04-26 NOTE — ED Provider Notes (Signed)
[facility]    CSN: [id number] Arrival date & time: 04/25/19  1531      History   Chief Complaint Chief Complaint  Patient presents with  . Dental Pain  . Facial Swelling    HPI Joanne Hubbard is a 29 y.o. female history of anxiety, depression, asthma, presenting today for evaluation of dental pain, Covid testing and medication refill.  Patient states that over the past few days she has had increased pain and swelling to her left upper jaw.  She feels an area of her gums that have been swollen above a cracked tooth that she has.  She has also felt as if her lymph nodes in her neck have been swollen as well.  She noted fever up to couple days ago, but fevers have not been persistent.  She denies any difficulty swallowing or neck stiffness.  She denies any eye pain or changes in vision.  She has been using Tylenol without relief of pain.  Because of her fevers her work is requesting her to be tested for Covid prior to return.  Patient is also needing a refill of her sertraline.  She has been on this medicine since May 2020.  She takes it for anxiety/depression.  She is unsure if this medicine is helping her.  HPI  Past Medical History:  Diagnosis Date  . Anxiety   . Asthma    Resoved as child  . Bipolar 1 disorder (HCC)   . Chlamydia   . Depression   . Fetal alcohol spectrum disorder     Patient Active Problem List   Diagnosis Date Noted  . Diabetes in pregnancy 07/26/2018  . Preterm labor 07/18/2018  . Delivery of infant large for gestational age 40/25/2020  . Migraine without aura and without status migrainosus, not intractable 05/06/2018  . Muscle spasm 05/06/2018  . History of preterm delivery 04/15/2018  . S/P cesarean section 04/15/2018  . Herpes simplex type 2 (HSV-2) infection affecting pregnancy, antepartum 04/15/2018  . Uterine rupture during labor 04/15/2018  . Supervision of high risk pregnancy, antepartum 03/15/2018    Past Surgical History:    Procedure Laterality Date  . CESAREAN SECTION    . CESAREAN SECTION WITH BILATERAL TUBAL LIGATION N/A 07/18/2018   Procedure: CESAREAN SECTION with bilateral tubal ligation;  Surgeon: Tereso Newcomer, MD;  Location: [facility];  Service: Obstetrics;  Laterality: N/A;  . CHOLECYSTECTOMY    . uterine rupture      OB History    Gravida  7   Para  5   Term  3   Preterm  2   AB  2   Living  5     SAB  1   TAB  1   Ectopic      Multiple  0   Live Births  5            Home Medications    Prior to Admission medications   Medication Sig Start Date End Date Taking? Authorizing Provider  acetaminophen (TYLENOL) 325 MG tablet Take 650 mg by mouth every 6 (six) hours as needed for mild pain or fever.   Yes [provider]  ferrous sulfate 325 (65 FE) MG tablet Take 1 tablet (325 mg total) by mouth 2 (two) times daily with a meal. 07/21/18  Yes Tamera Stands, DO  Prenatal Vit-Fe Fumarate-FA (PRENATAL PLUS/IRON) 27-1 MG TABS Take 1 tablet by mouth daily. 03/15/18  Yes Raelyn Mora, CNM  amoxicillin (  AMOXIL) 500 MG capsule Take 1 capsule (500 mg total) by mouth 2 (two) times daily for 7 days. 04/25/19 05/02/19  Trinia Georgi C, PA-C  chlorhexidine (PERIDEX) 0.12 % solution Use as directed 15 mLs in the mouth or throat 2 (two) times daily. 04/25/19   Ansleigh Safer C, PA-C  ibuprofen (ADVIL) 800 MG tablet Take 1 tablet (800 mg total) by mouth 3 (three) times daily. 04/25/19   Lalisa Kiehn C, PA-C  sertraline (ZOLOFT) 50 MG tablet Take 1 tablet (50 mg total) by mouth daily. 04/25/19   Jeydan Barner, Elesa Hacker, PA-C    Family History Family History  Adopted: Yes  Family history unknown: Yes    Social History Social History   Tobacco Use  . Smoking status: Current Every Day Smoker    Packs/day: 0.25    Years: 7.00    Pack years: 1.75    Types: Cigarettes  . Smokeless tobacco: Never Used  Substance Use Topics  . Alcohol use: Not Currently    Comment: drank on  12-25-17  . Drug use: Not Currently     Allergies   Clonopin [clonazepam]   Review of Systems Review of Systems  Constitutional: Negative for activity change, appetite change, chills, fatigue and fever.  HENT: Positive for dental problem and facial swelling. Negative for congestion, ear pain, rhinorrhea, sinus pressure, sore throat and trouble swallowing.   Eyes: Negative for discharge and redness.  Respiratory: Negative for cough, chest tightness and shortness of breath.   Cardiovascular: Negative for chest pain.  Gastrointestinal: Negative for abdominal pain, diarrhea, nausea and vomiting.  Musculoskeletal: Negative for myalgias.  Skin: Negative for rash.  Neurological: Negative for dizziness, light-headedness and headaches.     Physical Exam Triage Vital Signs ED Triage Vitals  Enc Vitals Group     BP 04/25/19 1615 121/73     Pulse Rate 04/25/19 1615 90     Resp 04/25/19 1615 16     Temp 04/25/19 1615 98 F (36.7 C)     Temp Source 04/25/19 1615 Oral     SpO2 04/25/19 1615 99 %     Weight --      Height --      Head Circumference --      Peak Flow --      Pain Score 04/25/19 1612 6     Pain Loc --      Pain Edu? --      Excl. in [place]? --    No data found.  Updated Vital Signs BP 121/73 (BP Location: Right Arm)   Pulse 90   Temp 98 F (36.7 C) (Oral)   Resp 16   LMP 04/22/2019   SpO2 99%   Visual Acuity Right Eye Distance:   Left Eye Distance:   Bilateral Distance:    Right Eye Near:   Left Eye Near:    Bilateral Near:     Physical Exam Vitals and nursing note reviewed.  Constitutional:      General: She is not in acute distress.    Appearance: She is well-developed.  HENT:     Head: Normocephalic and atraumatic.     Comments: Mild facial swelling noted around left maxillary area    Ears:     Comments: Bilateral ears without tenderness to palpation of external auricle, tragus and mastoid, EAC's without erythema or swelling, TM's with good bony  landmarks and cone of light. Non erythematous.     Mouth/Throat:     Comments: No soft  palate swelling, uvula midline without swelling, posterior pharynx patent  Left upper jaw with 2 broken teeth noted posteriorly, surrounding gingival swelling and erythema around posterior broken molar Eyes:     Conjunctiva/sclera: Conjunctivae normal.  Neck:     Comments: No neck swelling or erythema, full active range of motion of neck Cardiovascular:     Rate and Rhythm: Normal rate and regular rhythm.     Heart sounds: No murmur.  Pulmonary:     Effort: Pulmonary effort is normal. No respiratory distress.     Breath sounds: Normal breath sounds.  Abdominal:     Palpations: Abdomen is soft.     Tenderness: There is no abdominal tenderness.  Musculoskeletal:     Cervical back: Neck supple.  Skin:    General: Skin is warm and dry.  Neurological:     Mental Status: She is alert.      UC Treatments / Results  Labs (all labs ordered are listed, but only abnormal results are displayed) Labs Reviewed  NOVEL CORONAVIRUS, NAA (HOSP ORDER, SEND-OUT TO REF LAB; TAT 18-24 HRS)    EKG   Radiology No results found.  Procedures Procedures (including critical care time)  Medications Ordered in UC Medications - No data to display  Initial Impression / Assessment and Plan / UC Course  I have reviewed the triage vital signs and the nursing notes.  Pertinent labs & imaging results that were available during my care of the patient were reviewed by me and considered in my medical decision making (see chart for details).     1.  Dental infection-gingival swelling suggestive of infection, cannot rule out small abscess at this time.  Initiating on amoxicillin x10 days to treat infection.  Tylenol and ibuprofen for pain.  Covid PCR pending given associated fevers to rule out for return to work.  2.  Anxiety/depression-medication refill-sertraline refilled, recommended to follow-up with OB/GYN/PCP  for further refills and monitoring and management of anxiety/depression.  Provided 1 months worth.  Discussed strict return precautions. Patient verbalized understanding and is agreeable with plan.  Final Clinical Impressions(s) / UC Diagnoses   Final diagnoses:  Dental infection  Encounter for laboratory testing for COVID-19 virus  Medication refill  Generalized anxiety disorder     Discharge Instructions     Please use dental resource to contact offices to seek permenant treatment/relief.   Begin amoxicillin for the next week to treat underlying infection causing pain  For pain please take 600mg -800mg  of Ibuprofen every 8 hours, take with 1000 mg of Tylenol Extra strength every 8 hours. These are safe to take together. Please take with food.   Please return if you start to experience significant swelling of your face, experiencing fever.  I provided 1 month refill of your Zoloft/sertraline-follow-up with OB/GYN/primary care for further refills and management of anxiety/depression   ED Prescriptions    Medication Sig Dispense Auth. Provider   amoxicillin (AMOXIL) 500 MG capsule Take 1 capsule (500 mg total) by mouth 2 (two) times daily for 7 days. 14 capsule Surabhi Gadea C, PA-C   chlorhexidine (PERIDEX) 0.12 % solution Use as directed 15 mLs in the mouth or throat 2 (two) times daily. 120 mL Marvell Stavola C, PA-C   sertraline (ZOLOFT) 50 MG tablet Take 1 tablet (50 mg total) by mouth daily. 30 tablet Awais Cobarrubias C, PA-C   ibuprofen (ADVIL) 800 MG tablet Take 1 tablet (800 mg total) by mouth 3 (three) times daily. 21 tablet Anjanae Woehrle, [place] C,  PA-C     PDMP not reviewed this encounter.   Romey Mathieson, [place] C, PA-C 04/26/19 1006

## 2019-04-27 LAB — NOVEL CORONAVIRUS, NAA (HOSP ORDER, SEND-OUT TO REF LAB; TAT 18-24 HRS): SARS-CoV-2, NAA: NOT DETECTED

## 2019-05-21 IMAGING — US US MFM OB FOLLOW UP
1 series · 14 of 28 positions shown · non-contrast
Comparison: none

[Series 1: us mfm ob follow up · 37 acquisitions, 14 frames shown]
[im 2/37]
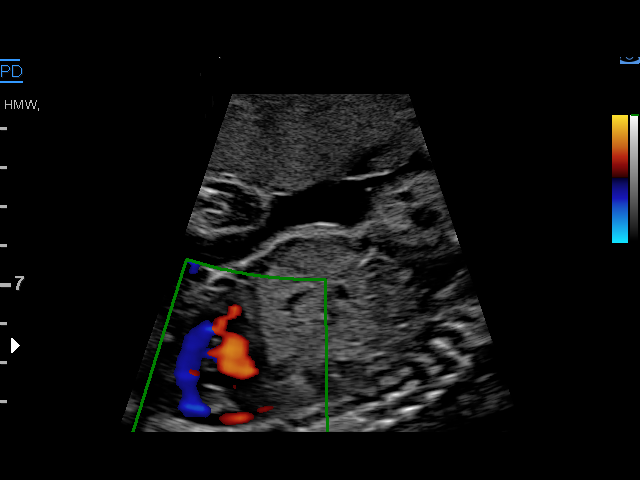
[im 5/37]
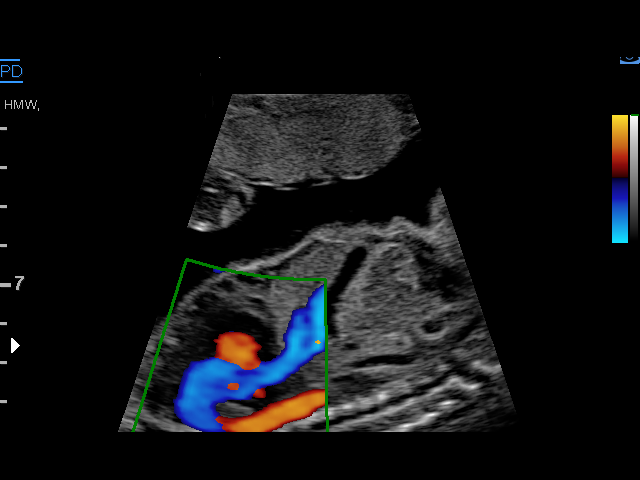
[im 7/37]
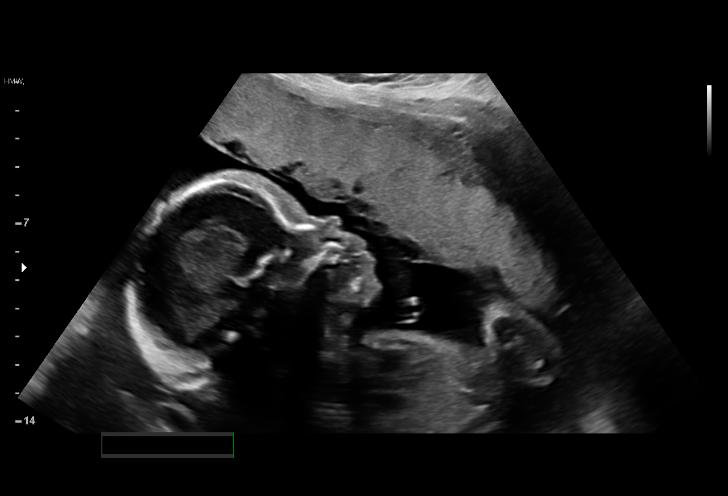
[im 10/37]
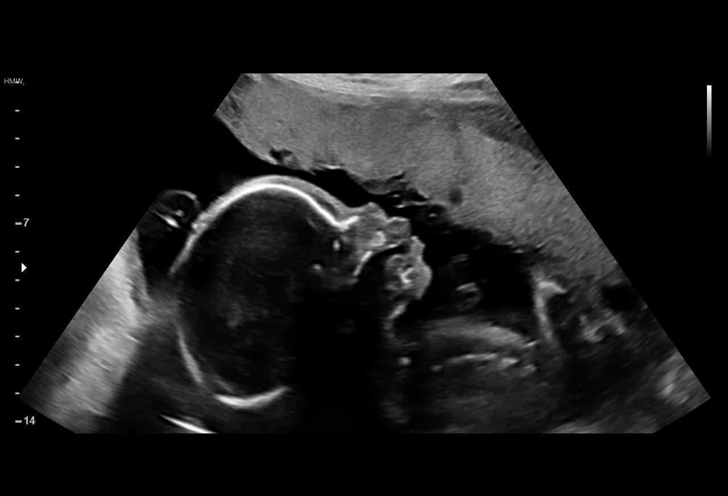
[im 13/37]
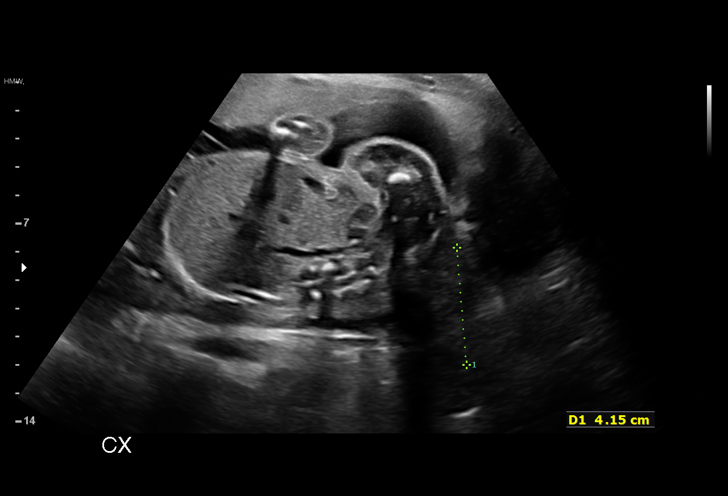
[im 15/37]
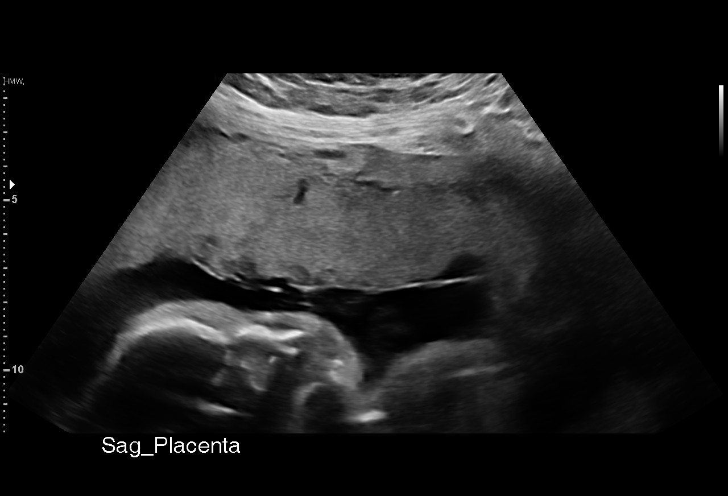
[im 18/37]
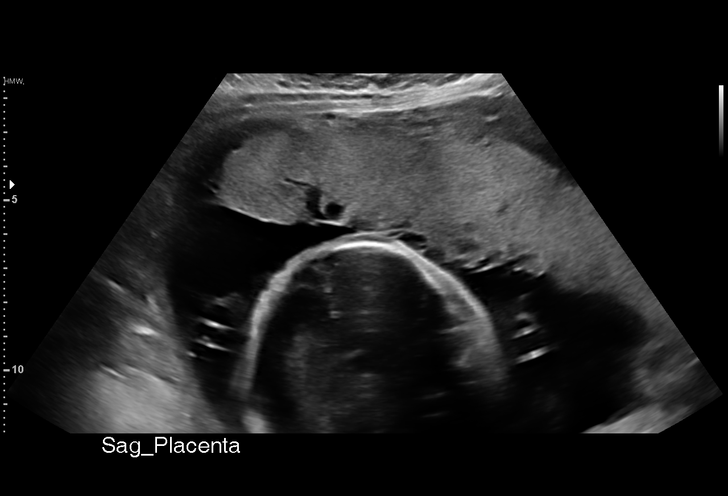
[im 21/37]
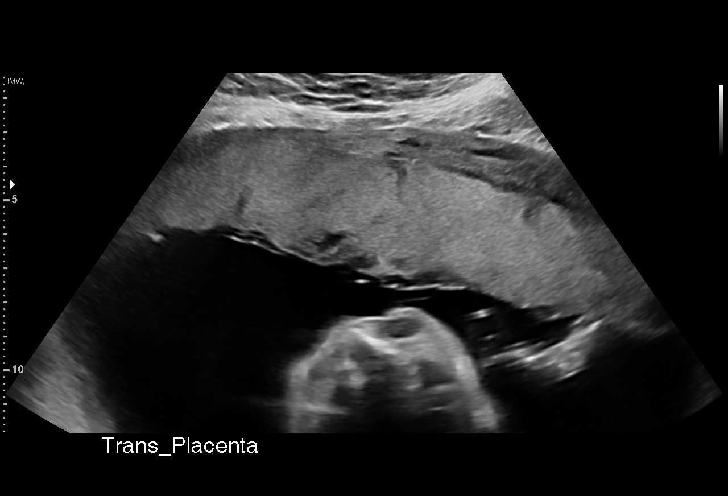
[im 23/37]
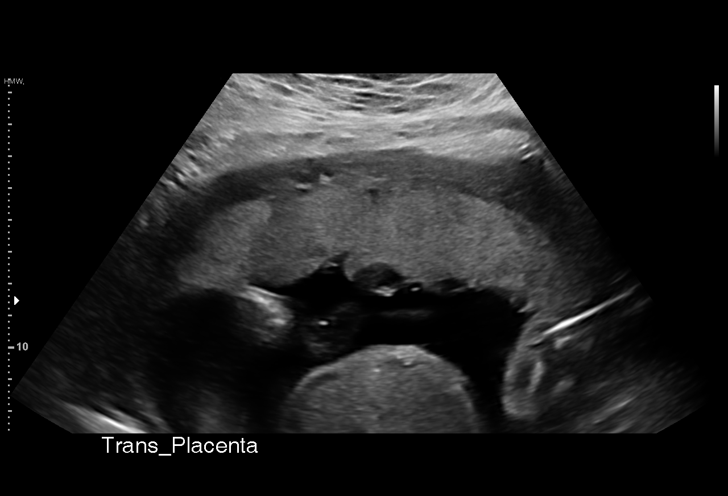
[im 26/37]
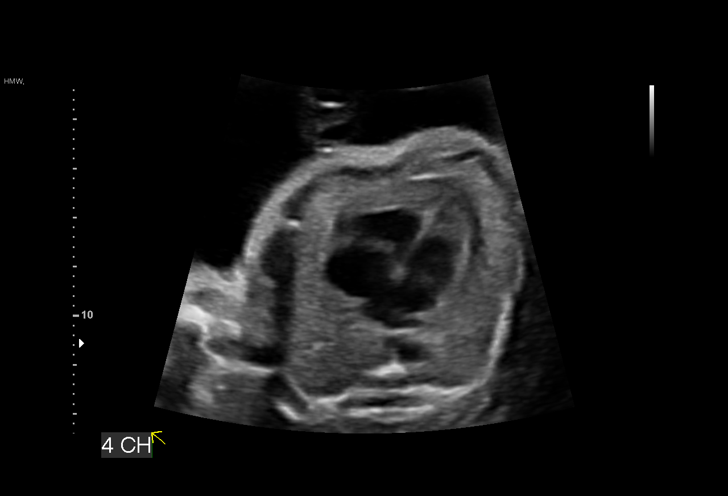
[im 29/37]
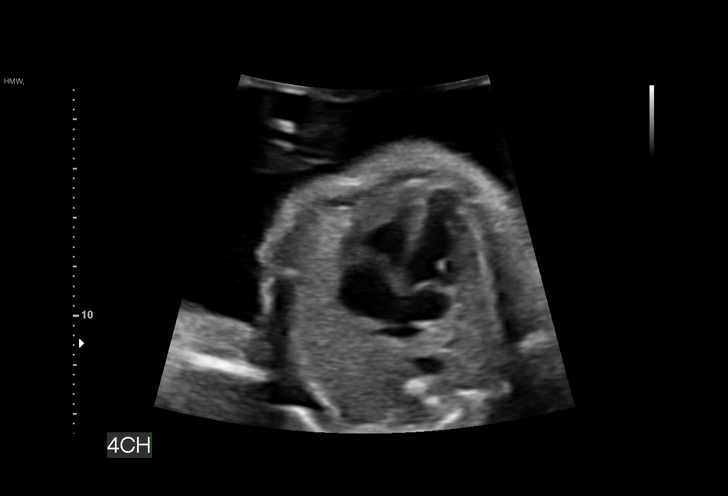
[im 31/37]
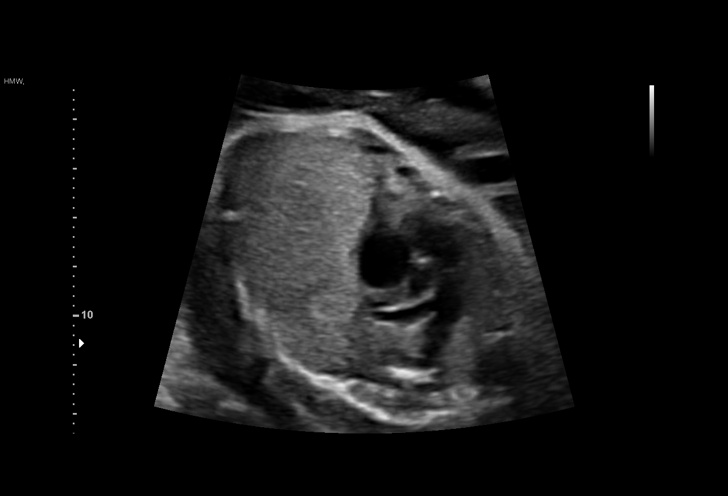
[im 34/37]
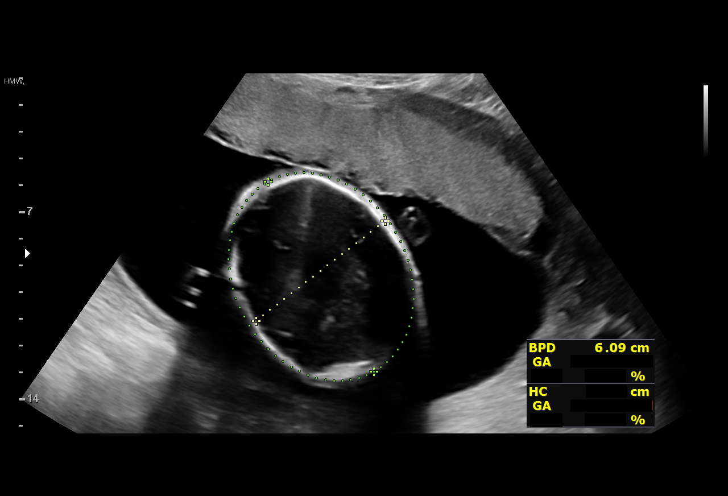
[im 37/37]
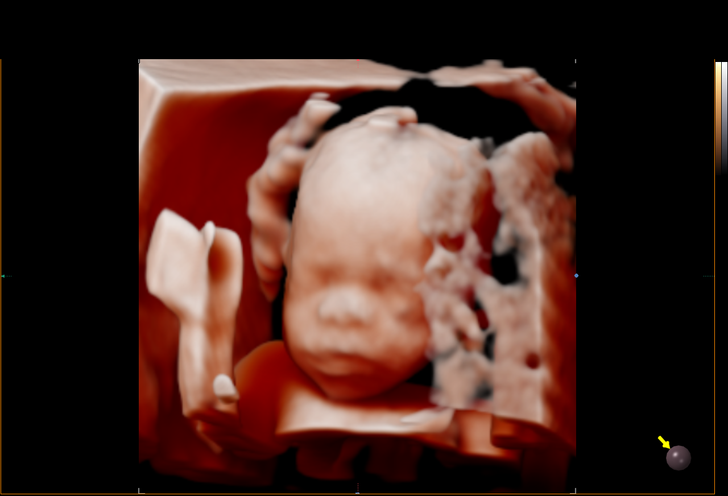

[14 of 28 positions shown; findings below may reference images not displayed]

----------------------------------------------------------------------

 ----------------------------------------------------------------------
Indications

  23 weeks gestation of pregnancy
  Poor obstetrical history (uterine rupture)
  Previous cesarean delivery, antepartum x 2
  Encounter for other antenatal screening
  follow-up (low risk NIPS)
 ----------------------------------------------------------------------
Fetal Evaluation

 Num Of Fetuses:          1
 Fetal Heart Rate(bpm):   145
 Cardiac Activity:        Observed
 Presentation:            Breech
 Placenta:                Anterior
 P. Cord Insertion:       Visualized, central

 Amniotic Fluid
 AFI FV:      Within normal limits

                             Largest Pocket(cm)

Biometry

 BPD:      60.9  mm     G. Age:  24w 5d         90  %    CI:        70.88   %    70 - 86
                                                         FL/HC:       18.4  %    19.2 -
 HC:      230.5  mm     G. Age:  25w 0d         92  %    HC/AC:       1.15       1.05 -
 AC:      201.2  mm     G. Age:  24w 5d         83  %    FL/BPD:      69.6  %    71 - 87
 FL:       42.4  mm     G. Age:  23w 6d         57  %    FL/AC:       21.1  %    20 - 24

 Est. FW:     697   gm     1 lb 9 oz     72  %
OB History

 Gravidity:    7         Term:   3        Prem:   1        SAB:   1
 TOP:          1       Ectopic:  0        Living: 4
Gestational Age

 LMP:           23w 2d        Date:  11/21/17                 EDD:   08/28/18
 U/S Today:     24w 4d                                        EDD:   08/19/18
 Best:          23w 2d     Det. By:  LMP  (11/21/17)          EDD:   08/28/18
Anatomy

 Cranium:               Appears normal         Aortic Arch:            Appears normal
 Cavum:                 Previously seen        Ductal Arch:            Appears normal
 Ventricles:            Appears normal         Diaphragm:              Previously seen
 Choroid Plexus:        Previously seen        Stomach:                Appears normal, left
                                                                       sided
 Cerebellum:            Previously seen        Abdomen:                Previously seen
 Posterior Fossa:       Previously seen        Abdominal Wall:         Previously seen
 Nuchal Fold:           Previously seen        Cord Vessels:           Previously seen
 Face:                  Profile nl; orbits     Kidneys:                Previously seen
                        prev visualized
 Lips:                  Previously seen        Bladder:                Appears normal
 Thoracic:              Appears normal         Spine:                  Previously seen
 Heart:                 Appears normal         Upper Extremities:      Previously seen
                        (4CH, axis, and
                        situs)
 RVOT:                  Appears normal         Lower Extremities:      Previously seen
 LVOT:                  Appears normal

 Other:  Fetus appears to be a male. Heels and 5th digit visualized previously.
         Nasal bone visualized previously.
Cervix Uterus Adnexa

 Cervix
 Length:            4.2  cm.
 Normal appearance by transabdominal scan.

 Uterus
 No abnormality visualized.

 Left Ovary
 No adnexal mass visualized.

 Right Ovary
 No adnexal mass visualized.

 Cul De Sac
 No free fluid seen.

 Adnexa
 No abnormality visualized.
Impression

 Normal interval growth.
 Anatomy complete
Recommendations

 Follow up growth as clinically indicated.

## 2019-06-02 DIAGNOSIS — S91311A Laceration without foreign body, right foot, initial encounter: Secondary | ICD-10-CM | POA: Diagnosis not present

## 2019-09-08 ENCOUNTER — Ambulatory Visit (INDEPENDENT_AMBULATORY_CARE_PROVIDER_SITE_OTHER)
Admission: RE | Admit: 2019-09-08 | Discharge: 2019-09-08 | Disposition: A | Discharge status: Home / Self Care | Payer: Medicare Other | Source: Ambulatory Visit

## 2019-09-08 DIAGNOSIS — K0889 Other specified disorders of teeth and supporting structures: Secondary | ICD-10-CM | POA: Diagnosis not present

## 2019-09-08 DIAGNOSIS — K047 Periapical abscess without sinus: Secondary | ICD-10-CM

## 2019-09-08 MED ORDER — AMOXICILLIN-POT CLAVULANATE 875-125 MG PO TABS: 1.0000 | ORAL_TABLET | Freq: Two times a day (BID) | ORAL | 0 refills | Status: AC

## 2019-09-08 MED ORDER — HYDROCODONE-ACETAMINOPHEN 5-325 MG PO TABS: 1.0000 | ORAL_TABLET | ORAL | 0 refills | Status: DC | PRN

## 2019-09-08 NOTE — Discharge Instructions (Addendum)
I have sent in Augmentin for you to take twice daily for 10 days  I have also sent in Norco for you to use 1 tablet every 4 hours as needed for pain  May continue with ibuprofen  I have also attached a dental resource guide for you to use to find a dentist that will help you  If symptoms or not improving, getting much worse, follow-up with the ER

## 2019-09-08 NOTE — ED Provider Notes (Signed)
[facility]  Virtual Visit via Video Note:  Kennya Schwenn  initiated request for Telemedicine visit with [facility] Ur[facility] team. I connected with Meta Hatchet  on 09/08/2019 at 2:09 PM  for a synchronized telemedicine visit using a video enabled HIPPA compliant telemedicine application. I verified that I am speaking with Meta Hatchet  using two identifiers. Marykay Lex, NP  was physically located in a [facility] Urgent care site and Amonie Wisser was located at a different location.   The limitations of evaluation and management by telemedicine as well as the availability of in-person appointments were discussed. Patient was informed that she  may incur a bill ( including co-pay) for this virtual visit encounter. Meta Hatchet  expressed understanding and gave verbal consent to proceed with virtual visit.   [id number] 09/08/19 Arrival Time: 1341  GB:MSXJ THROAT  SUBJECTIVE: History from: patient.  Joanne Hubbard is a 29 y.o. female who presents with gradual onset of right lower jaw pain.  Reports that she has had broken teeth for years.  Reports that she has not been to the dentist.  Reports she does not have dental insurance.  Reports that she is having multiple infections in her mouth.  Reports pain with chewing, no alleviating factors.  Has been taking ibuprofen and Tylenol with no relief.  Denies fever, chills, fatigue, ear pain, sinus pain, rhinorrhea, nasal congestion, cough, SOB, wheezing, chest pain, nausea, rash, changes in bowel or bladder habits.   ROS: As per HPI.  All other pertinent ROS negative.     Past Medical History:  Diagnosis Date  . Anxiety   . Asthma    Resoved as child  . Bipolar 1 disorder (HCC)   . Chlamydia   . Depression   . Fetal alcohol spectrum disorder    Past Surgical History:  Procedure Laterality Date  . CESAREAN SECTION    . CESAREAN SECTION WITH BILATERAL TUBAL LIGATION N/A 07/18/2018   Procedure: CESAREAN SECTION  with bilateral tubal ligation;  Surgeon: Tereso Newcomer, MD;  Location: [facility] LD ORS;  Service: Obstetrics;  Laterality: N/A;  . CHOLECYSTECTOMY    . uterine rupture     Allergies  Allergen Reactions  . Clonopin [Clonazepam]     "makes me more suicidal"   No current facility-administered medications on file prior to encounter.   Current Outpatient Medications on File Prior to Encounter  Medication Sig Dispense Refill  . acetaminophen (TYLENOL) 325 MG tablet Take 650 mg by mouth every 6 (six) hours as needed for mild pain or fever.    . chlorhexidine (PERIDEX) 0.12 % solution Use as directed 15 mLs in the mouth or throat 2 (two) times daily. 120 mL 0  . ferrous sulfate 325 (65 FE) MG tablet Take 1 tablet (325 mg total) by mouth 2 (two) times daily with a meal. 60 tablet 0  . ibuprofen (ADVIL) 800 MG tablet Take 1 tablet (800 mg total) by mouth 3 (three) times daily. 21 tablet 0  . Prenatal Vit-Fe Fumarate-FA (PRENATAL PLUS/IRON) 27-1 MG TABS Take 1 tablet by mouth daily. 30 each 12  . sertraline (ZOLOFT) 50 MG tablet Take 1 tablet (50 mg total) by mouth daily. 30 tablet 6   Social History   Socioeconomic History  . Marital status: Single    Spouse name: Not on file  . Number of children: 4  . Years of education: Not on file  . Highest education level: 11th grade  Occupational History  . Occupation:  Unemployed  Tobacco Use  . Smoking status: Current Every Day Smoker    Packs/day: 0.25    Years: 7.00    Pack years: 1.75    Types: Cigarettes  . Smokeless tobacco: Never Used  Vaping Use  . Vaping Use: Never used  Substance and Sexual Activity  . Alcohol use: Not Currently    Comment: drank on 12-25-17  . Drug use: Not Currently  . Sexual activity: Yes    Birth control/protection: None  Other Topics Concern  . Not on file  Social History Narrative   Behavior referral placed to [facility] for social issues. Patient also need a referral for social worker.   Social Determinants of  Health   Financial Resource Strain:   . Difficulty of Paying Living Expenses:   Food Insecurity:   . Worried About Programme researcher, broadcasting/film/video in the Last Year:   . Barista in the Last Year:   Transportation Needs:   . Freight forwarder (Medical):   Marland Kitchen Lack of Transportation (Non-Medical):   Physical Activity:   . Days of Exercise per Week:   . Minutes of Exercise per Session:   Stress:   . Feeling of Stress :   Social Connections:   . Frequency of Communication with Friends and Family:   . Frequency of Social Gatherings with Friends and Family:   . Attends Religious Services:   . Active Member of Clubs or Organizations:   . Attends Banker Meetings:   Marland Kitchen Marital Status:   Intimate Partner Violence:   . Fear of Current or Ex-Partner:   . Emotionally Abused:   Marland Kitchen Physically Abused:   . Sexually Abused:    Family History  Adopted: Yes  Family history unknown: Yes    OBJECTIVE:   There were no vitals filed for this visit.  General appearance: alert; no distress Eyes: EOMI grossly HENT: normocephalic; atraumatic Neck: supple with FROM Lungs: normal respiratory effort; speaking in full sentences without difficulty Extremities: moves extremities without difficulty Skin: No obvious rashes Neurologic: No facial asymmetries Psychological: alert and cooperative; normal mood and affect  ASSESSMENT & PLAN:  1. Pain, dental   2. Dental infection     Meds ordered this encounter  Medications  . amoxicillin-clavulanate (AUGMENTIN) 875-125 MG tablet    Sig: Take 1 tablet by mouth 2 (two) times daily for 10 days.    Dispense:  20 tablet    Refill:  0    Order Specific Question:   Supervising Provider    Answer:   Merrilee Jansky [id number]  . HYDROcodone-acetaminophen (NORCO/VICODIN) 5-325 MG tablet    Sig: Take 1 tablet by mouth every 4 (four) hours as needed for moderate pain or severe pain.    Dispense:  10 tablet    Refill:  0    Order Specific  Question:   Supervising Provider    Answer:   Merrilee Jansky [[id number]]     Prescribed Augmentin  Prescribed Norco for tooth pain Provided dental resource guide for patient as well Follow-up with dentist follow up with PCP if symptoms persists Return or go to ER if patient has any new or worsening symptoms such as fever, chills, nausea, vomiting, worsening sore throat, cough, abdominal pain, chest pain, changes in bowel or bladder habits, etc...  I discussed the assessment and treatment plan with the patient. The patient was provided an opportunity to ask questions and all were answered. The patient agreed with the plan  and demonstrated an understanding of the instructions.   The patient was advised to call back or seek an in-person evaluation if the symptoms worsen or if the condition fails to improve as anticipated.  I provided 10 minutes of non-face-to-face time during this encounter.  Marykay Lex, NP  09/08/2019 2:09 PM         Moshe Cipro, NP 09/08/19 1411

## 2019-09-12 ENCOUNTER — Ambulatory Visit (HOSPITAL_COMMUNITY)
Admission: EM | Admit: 2019-09-12 | Discharge: 2019-09-12 | Disposition: A | Discharge status: Home / Self Care | Payer: Medicare Other | Attending: Family Medicine | Admitting: Family Medicine

## 2019-09-12 ENCOUNTER — Encounter (HOSPITAL_COMMUNITY): Payer: Self-pay

## 2019-09-12 ENCOUNTER — Telehealth: Payer: Medicare Other

## 2019-09-12 ENCOUNTER — Other Ambulatory Visit: Payer: Self-pay

## 2019-09-12 DIAGNOSIS — K0889 Other specified disorders of teeth and supporting structures: Secondary | ICD-10-CM

## 2019-09-12 MED ORDER — NAPROXEN 500 MG PO TABS: 500.0000 mg | ORAL_TABLET | Freq: Two times a day (BID) | ORAL | 0 refills | Status: DC

## 2019-09-12 MED ORDER — TRAMADOL-ACETAMINOPHEN 37.5-325 MG PO TABS: 1.0000 | ORAL_TABLET | Freq: Four times a day (QID) | ORAL | 0 refills | Status: DC | PRN

## 2019-09-12 NOTE — ED Triage Notes (Signed)
Pt c/o pain in both side of teethx3 yrs, but it's got worse over the past 3 wks. PT states the pain on the right travels from her her right side of neck down to right breast. PT seen for a virtual visit a few days ago and given atx and pain meds.

## 2019-09-12 NOTE — ED Provider Notes (Signed)
[facility]    CSN: [id number] Arrival date & time: 09/12/19  1112      History   Chief Complaint Chief Complaint  Patient presents with  . Dental Pain    HPI Joanne Hubbard is a 29 y.o. female.   HPI Patient has had poor dentition, poor oral hygiene, fractured teeth for years.  She states that been more painful for the last 3 weeks.  She has tried Motrin and Tylenol which did not help.  She called to a video visit was given hydrocodone.  She tells me that this helps better.  I have told her that she will not get refills of her narcotic pain medication, that the only way that is going to ever get better is to see a dentist.  She has Medicare and Medicaid. She states the antibiotic does not help.  No fever chills.  No drainage Past Medical History:  Diagnosis Date  . Anxiety   . Asthma    Resoved as child  . Bipolar 1 disorder (HCC)   . Chlamydia   . Depression   . Fetal alcohol spectrum disorder     Patient Active Problem List   Diagnosis Date Noted  . Diabetes in pregnancy 07/26/2018  . Preterm labor 07/18/2018  . Delivery of infant large for gestational age 78/25/2020  . Migraine without aura and without status migrainosus, not intractable 05/06/2018  . Muscle spasm 05/06/2018  . History of preterm delivery 04/15/2018  . S/P cesarean section 04/15/2018  . Herpes simplex type 2 (HSV-2) infection affecting pregnancy, antepartum 04/15/2018  . Uterine rupture during labor 04/15/2018  . Supervision of high risk pregnancy, antepartum 03/15/2018    Past Surgical History:  Procedure Laterality Date  . CESAREAN SECTION    . CESAREAN SECTION WITH BILATERAL TUBAL LIGATION N/A 07/18/2018   Procedure: CESAREAN SECTION with bilateral tubal ligation;  Surgeon: Tereso Newcomer, MD;  Location: [facility];  Service: Obstetrics;  Laterality: N/A;  . CHOLECYSTECTOMY    . uterine rupture      OB History    Gravida  7   Para  5   Term  3   Preterm  2   AB  2     Living  5     SAB  1   TAB  1   Ectopic      Multiple  0   Live Births  5            Home Medications    Prior to Admission medications   Medication Sig Start Date End Date Taking? Authorizing Provider  acetaminophen (TYLENOL) 325 MG tablet Take 650 mg by mouth every 6 (six) hours as needed for mild pain or fever.    [provider]  amoxicillin-clavulanate (AUGMENTIN) 875-125 MG tablet Take 1 tablet by mouth 2 (two) times daily for 10 days. 09/08/19 09/18/19  Moshe Cipro, NP  ferrous sulfate 325 (65 FE) MG tablet Take 1 tablet (325 mg total) by mouth 2 (two) times daily with a meal. 07/21/18   Tamera Stands, DO  HYDROcodone-acetaminophen (NORCO/VICODIN) 5-325 MG tablet Take 1 tablet by mouth every 4 (four) hours as needed for moderate pain or severe pain. 09/08/19   Moshe Cipro, NP  naproxen (NAPROSYN) 500 MG tablet Take 1 tablet (500 mg total) by mouth 2 (two) times daily. 09/12/19   Eustace Moore, MD  traMADol-acetaminophen (ULTRACET) 37.5-325 MG tablet Take 1-2 tablets by mouth every 6 (six) hours as needed.  09/12/19   Eustace Moore, MD  sertraline (ZOLOFT) 50 MG tablet Take 1 tablet (50 mg total) by mouth daily. 04/25/19 09/12/19  Wieters, Junius Creamer, PA-C    Family History Family History  Adopted: Yes  Family history unknown: Yes    Social History Social History   Tobacco Use  . Smoking status: Current Every Day Smoker    Packs/day: 0.25    Years: 7.00    Pack years: 1.75    Types: Cigarettes  . Smokeless tobacco: Never Used  Vaping Use  . Vaping Use: Never used  Substance Use Topics  . Alcohol use: Not Currently    Comment: drank on 12-25-17  . Drug use: Not Currently     Allergies   Clonopin [clonazepam]   Review of Systems Review of Systems See HPI  Physical Exam Triage Vital Signs ED Triage Vitals  Enc Vitals Group     BP 09/12/19 1134 (!) 150/91     Pulse Rate 09/12/19 1134 99     Resp 09/12/19 1134 16      Temp 09/12/19 1134 98.6 F (37 C)     Temp Source 09/12/19 1134 Oral     SpO2 09/12/19 1134 100 %     Weight 09/12/19 1139 180 lb (81.6 kg)     Height 09/12/19 1139 5\' 3"  (1.6 m)     Head Circumference --      Peak Flow --      Pain Score 09/12/19 1139 6     Pain Loc --      Pain Edu? --      Excl. in GC? --    No data found.  Updated Vital Signs BP (!) 150/91   Pulse 99   Temp 98.6 F (37 C) (Oral)   Resp 16   Ht 5\' 3"  (1.6 m)   Wt 81.6 kg   SpO2 100%   BMI 31.89 kg/m      Physical Exam Constitutional:      General: She is not in acute distress.    Appearance: She is well-developed.     Comments: Articular  HENT:     Head: Normocephalic and atraumatic.     Mouth/Throat:     Comments: Poor oral hygiene.  Multiple fractures.  Inflamed gums and periodontal disease Eyes:     Conjunctiva/sclera: Conjunctivae normal.     Pupils: Pupils are equal, round, and reactive to light.  Cardiovascular:     Rate and Rhythm: Normal rate.  Pulmonary:     Effort: Pulmonary effort is normal. No respiratory distress.  Abdominal:     General: There is no distension.     Palpations: Abdomen is soft.  Musculoskeletal:        General: Normal range of motion.     Cervical back: Normal range of motion.  Skin:    General: Skin is warm and dry.  Neurological:     Mental Status: She is alert.  Psychiatric:        Mood and Affect: Mood normal.        Behavior: Behavior normal.      UC Treatments / Results  Labs (all labs ordered are listed, but only abnormal results are displayed) Labs Reviewed - No data to display  EKG   Radiology No results found.  Procedures Procedures (including critical care time)  Medications Ordered in UC Medications - No data to display  Initial Impression / Assessment and Plan / UC Course  I have reviewed  the triage vital signs and the nursing notes.  Pertinent labs & imaging results that were available during my care of the patient were  reviewed by me and considered in my medical decision making (see chart for details).     *We will give Ultracet for pain.  However this will not be refilled.  I gave her a dental resource and highlighted the numbers to call. Final Clinical Impressions(s) / UC Diagnoses   Final diagnoses:  Pain, dental     Discharge Instructions     Continue soft foods Call today to the dental offices I am providing you with numbers and resources. Take naproxen 500 mg 2 times a day with food.  Stop ibuprofen while on naproxen Take Ultracet 2 tablets every 6 hours if needed in addition to the naproxen Do not take Tylenol while Ultracet We will not refill controlled substances   ED Prescriptions    Medication Sig Dispense Auth. Provider   naproxen (NAPROSYN) 500 MG tablet Take 1 tablet (500 mg total) by mouth 2 (two) times daily. 30 tablet Eustace Moore, MD   traMADol-acetaminophen (ULTRACET) 37.5-325 MG tablet Take 1-2 tablets by mouth every 6 (six) hours as needed. 20 tablet Eustace Moore, MD     I have reviewed the PDMP during this encounter.   Eustace Moore, MD 09/12/19 2116

## 2019-09-12 NOTE — Discharge Instructions (Signed)
Continue soft foods Call today to the dental offices I am providing you with numbers and resources. Take naproxen 500 mg 2 times a day with food.  Stop ibuprofen while on naproxen Take Ultracet 2 tablets every 6 hours if needed in addition to the naproxen Do not take Tylenol while Ultracet We will not refill controlled substances

## 2019-09-14 ENCOUNTER — Ambulatory Visit (HOSPITAL_COMMUNITY)
Admission: EM | Admit: 2019-09-14 | Discharge: 2019-09-14 | Disposition: A | Discharge status: Home / Self Care | Payer: Medicare Other | Attending: Family | Admitting: Family

## 2019-09-14 ENCOUNTER — Other Ambulatory Visit: Payer: Self-pay

## 2019-09-14 DIAGNOSIS — F1610 Hallucinogen abuse, uncomplicated: Secondary | ICD-10-CM | POA: Insufficient documentation

## 2019-09-14 DIAGNOSIS — F4323 Adjustment disorder with mixed anxiety and depressed mood: Secondary | ICD-10-CM

## 2019-09-14 DIAGNOSIS — R5383 Other fatigue: Secondary | ICD-10-CM | POA: Diagnosis not present

## 2019-09-14 NOTE — Discharge Instructions (Addendum)

## 2019-09-14 NOTE — Progress Notes (Signed)
Joanne Hubbard received her discharge AVS, she had her appointment card in hand and stated it helped to talk with a person. Her questions were answered and she received her personal belongings and was escorted to the front lobby.
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
## 2019-09-14 NOTE — ED Provider Notes (Signed)
Behavioral Health Medical Screening Exam  Joanne Hubbard is a 29 y.o. female who presents voluntarily, for walk-in assessment, to [facility].  Patient assessed by nurse practitioner.  Patient alert and oriented, answers appropriately. Patient reports she is currently seeking to establish outpatient psychiatric provider.  Patient reports she was last seen by outpatient psychiatrist in 2019, prior to her relocation from [place] to [place] in 2019. Patient reports recently "feeling like I have no energy or low energy."  Patient reports use of ecstasy "I used to take one ecstasy per day it does not make me feel good but it does give me energy but lately it has stopped working."  Patient reports history of prescription for Concerta.  Patient expressed that she would like to discuss resuming medications with outpatient psychiatry. Patient denies suicidal and homicidal ideations.  Patient denies auditory visual hallucinations.  Patient denies symptoms of paranoia.  Patient does not appear to be responding to internal stimuli. Patient reports she resides in [place] with her 5 children.  Patient denies access to weapons.  Patient currently not employed outside of home.  Patient reports use of ecstasy, last use approximately 2 days ago.  Patient denies alcohol and substance use aside from the ecstasy. Patient reports recently attempting to secure new outpatient psychiatric provider.  Patient reports scheduling appointment with [facility] for August 26 and contacting [facility] to request case manager.  Patient also recently followed up with [facility] regarding difficult relationship with ex-boyfriend and father of child. Patient reports she recently requested her medical record from provider in [place].  Patient reports concern that medical record includes diagnosis of mood disorder NOS.  Patient states "I just want to make sure I know everything that is going on  with me so that I can move forward in my adult life." Patient offered support and encouragement.  Total Time spent with patient: 30 minutes  Psychiatric Specialty Exam  Presentation  General Appearance:Appropriate for Environment;Fairly Groomed  Eye Contact:Good  Speech:Normal Rate  Speech Volume:Normal  Handedness:Right   Mood and Affect  Mood:Euthymic  Affect:Congruent   Thought Process  Thought Processes:Coherent;Goal Directed  Descriptions of Associations:Intact  Orientation:Full (Time, Place and Person)  Thought Content:WDL  Hallucinations:None  Ideas of Reference:None  Suicidal Thoughts:No  Homicidal Thoughts:No   Sensorium  Memory:Immediate Good;Recent Good;Remote Good  Judgment:Fair  Insight:Fair   Executive Functions  Concentration:Fair  Attention Span:Fair  Recall:Fair  Fund of Knowledge:Fair  Language:Fair   Psychomotor Activity  Psychomotor Activity:Normal   Assets  Assets:Communication Skills;Desire for Improvement;Financial Resources/Insurance;Housing;Intimacy;Leisure Time;Physical Health;Resilience;Social Support;Talents/Skills   Sleep  Sleep:Fair  Number of hours: No data recorded  Physical Exam: Physical Exam Vitals and nursing note reviewed.  Constitutional:      Appearance: She is well-developed.  HENT:     Head: Normocephalic.  Cardiovascular:     Rate and Rhythm: Normal rate.  Pulmonary:     Effort: Pulmonary effort is normal.  Neurological:     Mental Status: She is alert and oriented to person, place, and time.  Psychiatric:        Attention and Perception: Attention and perception normal.        Mood and Affect: Mood and affect normal.        Speech: Speech normal.        Behavior: Behavior normal. Behavior is cooperative.        Thought Content: Thought content normal.        Cognition and Memory: Cognition normal.  Judgment: Judgment normal.    Review of Systems  Constitutional:  Negative.   HENT: Negative.   Eyes: Negative.   Respiratory: Negative.   Cardiovascular: Negative.   Gastrointestinal: Negative.   Genitourinary: Negative.   Musculoskeletal: Negative.   Skin: Negative.   Neurological: Negative.   Endo/Heme/Allergies: Negative.   Psychiatric/Behavioral: Positive for substance abuse.   Blood pressure (!) 130/83, pulse 98, temperature 98.5 F (36.9 C), temperature source Oral, resp. rate 16, height 5\' 3"  (1.6 m), weight 81.6 kg, SpO2 100 %, unknown if currently breastfeeding. Body mass index is 31.89 kg/m.  Musculoskeletal: Strength & Muscle Tone: within normal limits Gait & Station: normal Patient leans: N/A   Recommendations: Patient reviewed with Dr. . Recommend follow-up with outpatient psychiatry and talk therapy.  Based on my evaluation the patient does not appear to have an emergency medical condition.  Nelly Rout, FNP 09/14/2019, 2:09 PM

## 2019-09-14 NOTE — ED Notes (Signed)
Belongings in locker 26 

## 2019-10-05 ENCOUNTER — Ambulatory Visit (HOSPITAL_COMMUNITY)
Admission: EM | Admit: 2019-10-05 | Discharge: 2019-10-05 | Discharge status: Absent without leave | Payer: Medicare Other

## 2019-10-12 ENCOUNTER — Other Ambulatory Visit: Payer: Self-pay

## 2019-10-12 ENCOUNTER — Ambulatory Visit (INDEPENDENT_AMBULATORY_CARE_PROVIDER_SITE_OTHER): Payer: Medicare Other | Admitting: Clinical

## 2019-10-12 DIAGNOSIS — F331 Major depressive disorder, recurrent, moderate: Secondary | ICD-10-CM | POA: Diagnosis not present

## 2019-10-12 NOTE — Progress Notes (Signed)
THERAPIST PROGRESS NOTE Virtual Visit via Video Note  I connected with Meta Hatchet on 10/12/19 at  1:00 PM EDT by a video enabled telemedicine application and verified that I am speaking with the correct person using two identifiers.  Location: Patient: Home Provider: Office   I discussed the limitations of evaluation and management by telemedicine and the availability of in person appointments. The patient expressed understanding and agreed to proceed.   Follow Up Instructions:    I discussed the assessment and treatment plan with the patient. The patient was provided an opportunity to ask questions and all were answered. The patient agreed with the plan and demonstrated an understanding of the instructions.   The patient was advised to call back or seek an in-person evaluation if the symptoms worsen or if the condition fails to improve as anticipated.    Session Time: 32 minutes  Participation Level: Active  Behavioral Response: CasualAlertDepressed  Type of Therapy: Individual Therapy  Treatment Goals addressed: Diagnosis: Depression  Interventions: Supportive  Summary:  Joanne Hubbard is a 29 y.o. female who presents oriented times five, appropriately dressed and friendly. Client denied hallucinations and delusions. Client presents as a hospital discharge from [facility] on 09/14/19 for voluntary treatment for depression and anxiety. Client reported she moved from [place] to [place] in 2019 with her five children and father of her children. Client reported she has been looking for assistance for intellectual and mental disabilities since her move to [place]. Client reported previous history in and out of inpatient treatment between 2006 to 2009. Client reported over the year she stopped taking medications during her pregnancies. Client reported she was born with fetal alcohol syndrome. Client reported dealing with daily stressors caught up with her with  which led to her service at urgent care. Client reported she was adopted into a family with other siblings who too were adopted. Client reported her home town of [place] became a negative environment for her as she became older. Client reported as they family got older everyone grew apart and lived their own life. Client reported her other adopted siblings had biological family members to go to but she did not. Client reported prior use of ecstasy to help her with motivation to do things but no longer has use.  Client reported with therapy she would like to work on coping skills and how to learn how to problem solve situations opposed to becoming discouraged and shutting down.     Counselor from 10/12/2019 in [facility]  PHQ-9 Total Score 18     GAD 7 : Generalized Anxiety Score 10/12/2019 04/05/2018  Nervous, Anxious, on Edge 3 2  Control/stop worrying 2 1  Worry too much - different things 2 1  Trouble relaxing 2 1  Restless 1 0  Easily annoyed or irritable 1 1  Afraid - awful might happen 1 0  Total GAD 7 Score 12 6  Anxiety Difficulty Somewhat difficult -       Suicidal/Homicidal: Nowithout intent/plan  Therapist Response:  Therapist rendered the session in regard to follow up for outpatient services since the clients discharge from [facility]. Therapist made introductions and discussed confidentiality. Therapist asked open ended questions to gather information about the clients background and mental health history. Therapist engaged the client by asking open ended questions about her current mental health symptoms. Therapist completed the appropriate SDOH with clients input. Therapist completed treatment plan with the clients input. Therapist assisted with scheduling  next appointments. Therapist provided the client with emergency contact information for behavioral crisis.      Plan: Return again in 4 weeks for individual  therapy.  Diagnosis: Major depressive disorder, recurrent episode, moderate with anxious distress   Neena Rhymes Ayoub Arey, LCSW 10/12/2019

## 2019-10-16 ENCOUNTER — Encounter (HOSPITAL_COMMUNITY): Payer: Self-pay

## 2019-10-16 ENCOUNTER — Ambulatory Visit (HOSPITAL_COMMUNITY)
Admission: EM | Admit: 2019-10-16 | Discharge: 2019-10-16 | Disposition: A | Discharge status: Home / Self Care | Payer: Medicare Other | Attending: Family Medicine | Admitting: Family Medicine

## 2019-10-16 ENCOUNTER — Emergency Department (HOSPITAL_COMMUNITY)
Admission: EM | Admit: 2019-10-16 | Discharge: 2019-10-16 | Disposition: A | Discharge status: Home / Self Care | Payer: Medicare Other

## 2019-10-16 DIAGNOSIS — Z20822 Contact with and (suspected) exposure to covid-19: Secondary | ICD-10-CM | POA: Diagnosis not present

## 2019-10-16 DIAGNOSIS — J069 Acute upper respiratory infection, unspecified: Secondary | ICD-10-CM | POA: Insufficient documentation

## 2019-10-16 LAB — SARS CORONAVIRUS 2 (TAT 6-24 HRS): SARS Coronavirus 2: NEGATIVE

## 2019-10-16 MED ORDER — DM-GUAIFENESIN ER 30-600 MG PO TB12: 1.0000 | ORAL_TABLET | Freq: Two times a day (BID) | ORAL | 0 refills | Status: AC

## 2019-10-16 NOTE — Discharge Instructions (Addendum)
Go home to rest Drink plenty of fluids Take Tylenol for pain or fever You may take over-the-counter cough and cold medicines as needed You must quarantine at home until your test result is available You can check for your test result in MyChart If your Covid test is positive, call for new work note

## 2019-10-16 NOTE — ED Triage Notes (Signed)
Pt c/o sore throat, dysphagia, non-productive cough, chills, body achesx2 days. Pt states COVID exposure.

## 2019-10-16 NOTE — ED Provider Notes (Signed)
[facility]    CSN: [id number] Arrival date & time: 10/16/19  1020      History   Chief Complaint Chief Complaint  Patient presents with  . Cough    HPI Joanne Hubbard is a 29 y.o. female.   HPI  Patient states that her children went with her father to visit family.  They later found out that the father's brother, children's uncle is positive for Covid.  The father and children are asymptomatic.  This patient has sore throat, pain with swallowing, nonproductive cough, chills and body aches and headache for 2 days.  She thinks she has had Covid exposure.  She needs a note for work.  She has not had Covid vaccinations  Past Medical History:  Diagnosis Date  . Anxiety   . Asthma    Resoved as child  . Bipolar 1 disorder (HCC)   . Chlamydia   . Depression   . Fetal alcohol spectrum disorder     Patient Active Problem List   Diagnosis Date Noted  . Diabetes in pregnancy 07/26/2018  . Preterm labor 07/18/2018  . Delivery of infant large for gestational age 44/25/2020  . Migraine without aura and without status migrainosus, not intractable 05/06/2018  . Muscle spasm 05/06/2018  . History of preterm delivery 04/15/2018  . S/P cesarean section 04/15/2018  . Herpes simplex type 2 (HSV-2) infection affecting pregnancy, antepartum 04/15/2018  . Uterine rupture during labor 04/15/2018  . Supervision of high risk pregnancy, antepartum 03/15/2018    Past Surgical History:  Procedure Laterality Date  . CESAREAN SECTION    . CESAREAN SECTION WITH BILATERAL TUBAL LIGATION N/A 07/18/2018   Procedure: CESAREAN SECTION with bilateral tubal ligation;  Surgeon: Tereso Newcomer, MD;  Location: [facility];  Service: Obstetrics;  Laterality: N/A;  . CHOLECYSTECTOMY    . uterine rupture      OB History    Gravida  7   Para  5   Term  3   Preterm  2   AB  2   Living  5     SAB  1   TAB  1   Ectopic      Multiple  0   Live Births  5            Home  Medications    Prior to Admission medications   Medication Sig Start Date End Date Taking? Authorizing Provider  acetaminophen (TYLENOL) 325 MG tablet Take 650 mg by mouth every 6 (six) hours as needed for mild pain or fever.    [provider]  dextromethorphan-guaiFENesin (MUCINEX DM) 30-600 MG 12hr tablet Take 1 tablet by mouth 2 (two) times daily. 10/16/19   Eustace Moore, MD  ferrous sulfate 325 (65 FE) MG tablet Take 1 tablet (325 mg total) by mouth 2 (two) times daily with a meal. 07/21/18 10/16/19  Tamera Stands, DO  sertraline (ZOLOFT) 50 MG tablet Take 1 tablet (50 mg total) by mouth daily. 04/25/19 09/12/19  Wieters, Junius Creamer, PA-C    Family History Family History  Adopted: Yes  Family history unknown: Yes    Social History Social History   Tobacco Use  . Smoking status: Current Every Day Smoker    Packs/day: 0.25    Years: 7.00    Pack years: 1.75    Types: Cigarettes  . Smokeless tobacco: Never Used  Vaping Use  . Vaping Use: Never used  Substance Use Topics  . Alcohol use:  Not Currently    Comment: drank on 12-25-17  . Drug use: Not Currently     Allergies   Clonopin [clonazepam]   Review of Systems Review of Systems See HPI  Physical Exam Triage Vital Signs ED Triage Vitals  Enc Vitals Group     BP 10/16/19 1147 127/86     Pulse Rate 10/16/19 1147 86     Resp 10/16/19 1147 16     Temp 10/16/19 1147 98.5 F (36.9 C)     Temp Source 10/16/19 1147 Oral     SpO2 10/16/19 1147 97 %     Weight 10/16/19 1148 185 lb (83.9 kg)     Height 10/16/19 1148 5\' 3"  (1.6 m)     Head Circumference --      Peak Flow --      Pain Score 10/16/19 1147 5     Pain Loc --      Pain Edu? --      Excl. in GC? --    No data found.  Updated Vital Signs BP 127/86   Pulse 86   Temp 98.5 F (36.9 C) (Oral)   Resp 16   Ht 5\' 3"  (1.6 m)   Wt 83.9 kg   SpO2 97%   BMI 32.77 kg/m       Physical Exam Constitutional:      General: She is not in acute  distress.    Appearance: She is well-developed.  HENT:     Head: Normocephalic and atraumatic.     Right Ear: Tympanic membrane, ear canal and external ear normal.     Left Ear: Tympanic membrane, ear canal and external ear normal.     Nose: Rhinorrhea present.     Comments: Clear rhinorrhea    Mouth/Throat:     Mouth: Mucous membranes are dry.     Comments: Mouth slightly dry.  Posterior pharynx mildly red.  No exudate Eyes:     Conjunctiva/sclera: Conjunctivae normal.     Pupils: Pupils are equal, round, and reactive to light.  Cardiovascular:     Rate and Rhythm: Normal rate and regular rhythm.     Heart sounds: Normal heart sounds.  Pulmonary:     Effort: Pulmonary effort is normal. No respiratory distress.     Comments: Lungs are clear Abdominal:     General: There is no distension.     Palpations: Abdomen is soft.  Musculoskeletal:        General: Normal range of motion.     Cervical back: Normal range of motion.  Lymphadenopathy:     Cervical: Cervical adenopathy present.  Skin:    General: Skin is warm and dry.  Neurological:     Mental Status: She is alert.  Psychiatric:        Mood and Affect: Mood normal.        Behavior: Behavior normal.      UC Treatments / Results  Labs (all labs ordered are listed, but only abnormal results are displayed) Labs Reviewed  SARS CORONAVIRUS 2 (TAT 6-24 HRS)    EKG   Radiology No results found.  Procedures Procedures (including critical care time)  Medications Ordered in UC Medications - No data to display  Initial Impression / Assessment and Plan / UC Course  I have reviewed the triage vital signs and the nursing notes.  Pertinent labs & imaging results that were available during my care of the patient were reviewed by me and considered in my medical  decision making (see chart for details).    Reviewed the patient likely has a viral infection.  Possible Covid.  Quarantine as discussed.   Final Clinical  Impressions(s) / UC Diagnoses   Final diagnoses:  Viral URI with cough  Contact with and (suspected) exposure to covid-19     Discharge Instructions     Go home to rest Drink plenty of fluids Take Tylenol for pain or fever You may take over-the-counter cough and cold medicines as needed You must quarantine at home until your test result is available You can check for your test result in MyChart If your Covid test is positive, call for new work note   ED Prescriptions    Medication Sig Dispense Auth. Provider   dextromethorphan-guaiFENesin (MUCINEX DM) 30-600 MG 12hr tablet Take 1 tablet by mouth 2 (two) times daily. 20 tablet Eustace Moore, MD     PDMP not reviewed this encounter.   Eustace Moore, MD 10/16/19 [phone]

## 2019-12-19 ENCOUNTER — Other Ambulatory Visit: Payer: Self-pay

## 2019-12-19 ENCOUNTER — Telehealth (HOSPITAL_COMMUNITY): Payer: Self-pay | Admitting: Clinical

## 2019-12-19 ENCOUNTER — Ambulatory Visit (HOSPITAL_COMMUNITY): Payer: Medicare Other | Admitting: Clinical

## 2019-12-19 NOTE — Telephone Encounter (Signed)
Therapist sent the client a text message link for the scheduled virtual therapy visit via MyChart. Client did not check in. Therapist followed up with a phone call to the clients cell phone. Therapist was not able to reach the client. Therapist left a voice mail letting client know an attempt was made to connect for the visit and to call the office back if she has questions.

## 2019-12-25 ENCOUNTER — Other Ambulatory Visit: Payer: Self-pay

## 2019-12-25 ENCOUNTER — Telehealth (HOSPITAL_COMMUNITY): Payer: Medicare Other | Admitting: Psychiatry

## 2020-01-09 ENCOUNTER — Ambulatory Visit (INDEPENDENT_AMBULATORY_CARE_PROVIDER_SITE_OTHER): Payer: Medicare Other | Admitting: Clinical

## 2020-01-09 ENCOUNTER — Other Ambulatory Visit: Payer: Self-pay

## 2020-01-09 DIAGNOSIS — F331 Major depressive disorder, recurrent, moderate: Secondary | ICD-10-CM | POA: Diagnosis not present

## 2020-01-09 NOTE — Progress Notes (Signed)
   THERAPIST PROGRESS NOTE  Session Time: 40 minutes  Participation Level: Active  Behavioral Response: CasualAlertDepressed  Type of Therapy: Individual Therapy  Treatment Goals addressed: Diagnosis: depession  Interventions: CBT  Summary:  Joanne Hubbard is a 29 y.o. female who presents for the scheduled session oriented times five, appropriately dressed, and friendly. Client denied hallucinations and delusions.  Client reported on today having depressed feelings.Client reported since she was last seen she was evicted from her previous apartment but has since moved into a new two bedroom apartment. Client reported she has been notified by Social Security that they well be holding her benefits due to alleged over payment from year prior. Client reported this has put her in a financial strain with her rent and providing for her five children. Client reported she is not sure how long her benefits will be withheld due to inability to get someone on the phone to talk to her about it. Client reported she feels like she is being punished for the systems mistake. Client reported she is working on getting connect with Tree surgeon. Client reported she will be going to social services to ask for assistance with paying her rent this week. Client reported she has no support since moving to [place] from [place]. Client reported she is working on feeling okay when her kids are not with her. Client reported when she is not with her kids her anxiety increases. Client reported she tries to distract her mind by doing other activities but it is hard for her to get sleep at night because of other thinking and worrying. Client reported she is taking two Benedryl per night to help her sleep. Client reported she feels stressed because she is having a hard time getting assistance for her IDD diagnosis related to her history of FAS. Client reported she is linked with a Chemical engineer and is awaiting a  referral to be tested for her disability. Client was tearful during the duration of the session while describing her stressors to the therapist.     Suicidal/Homicidal: Nowithout intent/plan  Therapist Response:  Therapist began the session by checking in and asking the client how she has been doing since last seen. Therapist actively listened to the clients thoughts and feelings. Therapist used CBT by normalizing and using empathy to give the client and opportunity to have her feelings of stress perceived clearly.  Therapist discussed with the client using prioritization to help with feeling overwhelmed. Therapist assisted with scheduling next appointments.    Plan: Return again in 4 weeks for individual therapy.  Diagnosis: Major depressive disorder, recurrent episode, moderate with anxious distress    Neena Rhymes Kodi Steil, LCSW 01/09/2020

## 2020-02-07 ENCOUNTER — Telehealth (HOSPITAL_COMMUNITY): Payer: Medicare Other | Admitting: Psychiatry

## 2020-02-07 ENCOUNTER — Other Ambulatory Visit: Payer: Self-pay

## 2020-03-04 ENCOUNTER — Telehealth (HOSPITAL_COMMUNITY): Payer: Self-pay | Admitting: Clinical

## 2020-03-04 ENCOUNTER — Ambulatory Visit (HOSPITAL_COMMUNITY): Payer: Medicare Other | Admitting: Clinical

## 2020-03-04 ENCOUNTER — Other Ambulatory Visit: Payer: Self-pay

## 2020-03-04 NOTE — Telephone Encounter (Signed)
Therapist sent the client a text message link via MyChart for the scheduled virtual therapy visit. Client did not respond to the link. Therapist followed up with a phone call to the client. Client did not answer. Therapist left a voice mail for the client to call the office back to reschedule.

## 2020-03-18 ENCOUNTER — Ambulatory Visit (HOSPITAL_COMMUNITY): Payer: Self-pay | Admitting: Clinical
